# Patient Record
Sex: Female | Born: 1975 | Race: Black or African American | Hispanic: No | Marital: Single | Smoking: Never smoker
Health system: Southern US, Community
[De-identification: ages and names within clinical notes are randomized; demographics above are authoritative.]

## PROBLEM LIST (undated history)

## (undated) DIAGNOSIS — Z8669 Personal history of other diseases of the nervous system and sense organs: Secondary | ICD-10-CM

## (undated) DIAGNOSIS — F419 Anxiety disorder, unspecified: Secondary | ICD-10-CM

## (undated) HISTORY — DX: Anxiety disorder, unspecified: F41.9

## (undated) HISTORY — DX: Personal history of other diseases of the nervous system and sense organs: Z86.69

---

## 2007-06-14 ENCOUNTER — Ambulatory Visit: Payer: Self-pay | Admitting: Obstetrics & Gynecology

## 2007-06-19 ENCOUNTER — Ambulatory Visit (HOSPITAL_COMMUNITY): Admission: RE | Admit: 2007-06-19 | Discharge: 2007-06-19 | Payer: Self-pay | Admitting: Family Medicine

## 2007-07-05 ENCOUNTER — Ambulatory Visit: Payer: Self-pay | Admitting: Obstetrics & Gynecology

## 2010-04-30 ENCOUNTER — Inpatient Hospital Stay (INDEPENDENT_AMBULATORY_CARE_PROVIDER_SITE_OTHER)
Admission: RE | Admit: 2010-04-30 | Discharge: 2010-04-30 | Disposition: A | Discharge status: Home / Self Care | Payer: Self-pay | Source: Ambulatory Visit | Attending: Family Medicine | Admitting: Family Medicine

## 2010-04-30 DIAGNOSIS — F411 Generalized anxiety disorder: Secondary | ICD-10-CM

## 2010-04-30 DIAGNOSIS — R000 Tachycardia, unspecified: Secondary | ICD-10-CM

## 2010-05-31 NOTE — Group Therapy Note (Signed)
NAMEVANIYA, Sarah Walton NO.:  [id number]   MEDICAL RECORD NO.:  [id number]          PATIENT TYPE:  WOC   LOCATION:  [facility]                   FACILITY:  [facility]   PHYSICIAN:  Allie Bossier, MD        DATE OF BIRTH:  [birth year]   DATE OF SERVICE:  06/14/2007                                  CLINIC NOTE   CHIEF COMPLAINT:  Left pelvic pain.   HISTORY OF PRESENT ILLNESS:  This is a 35 year old African American  female with no significant past medical history who presents with left  pelvic pain since March that occurs 2-3 days before her period and  abates as soon as her bleeding starts.  She does not have any pelvic  pain between periods.  She has no spotting between periods.  She has  normal regular periods every 30 days with moderate flow.  The pain is  not associated with nausea, vomiting, diarrhea, fevers, or chills.  She  has not been sexually active in over 5 years.  She was seen in November  2008 for a regular physical at [facility], where she had her Pap  smear that was normal, but was told at that time that she had some  tightness on her left adnexa by the physician.  She has no family  history of ovarian, uterine, or breast cancer.  She does note her mother  has had multiple ovarian cysts and had a hysterectomy for what she  believes was fibroids.   ALLERGIES:  Possibly PENICILLIN, but the patient is unsure.   CURRENT MEDICATION:  None.   IMMUNIZATIONS:  The patient is unclear of her immunization status for  tetanus and flu, and whether she had chickenpox or not as a child.   MENSTRUAL HISTORY:  She reports her menstrual cycles as regular, every  30 days, and periods lasting approximately 5 days.  Pain is described as  above.  No spotting between periods, and flow is medium.   CONTRACEPTIVE HISTORY:  The patient is not currently using any birth  control and is not sexually active times many years.   OBSTETRICAL HISTORY:  She has been pregnant  once that ended in elective  abortion approximately 12 years ago.   GYNECOLOGIC HISTORY:  Last Pap smear in December 06, 2006, that was  normal.  She has no history of abnormal Pap smears, but does not get  them regularly, and had not had one previous to 2008 since 1998.  Date  of last mammogram was December 06, 2006, that was normal.   SURGICAL HISTORY:  None.   FAMILY HISTORY:  Mother with high blood pressure and ovarian cyst and  fibroids.   PAST MEDICAL HISTORY:  None.   SOCIAL HISTORY:  She lives alone.  She works in Clinical biochemist.  She  does not smoke, drinks occasional alcohol, and occasional sodas.  She  has not been sexually active in many, many years and denies any physical  or sexual abuse.   REVIEW OF SYSTEMS:  Completely negative except for the problems  described in the HPI.  ASSESSMENT:  This is a 35 year old African American female with no  significant past medical history who presents with left pelvic pain.   PLAN:  We will get a pelvic ultrasound and have the patient return to  clinic for followup of further results in 2 weeks.  In the meantime, use  Motrin or Tylenol p.r.n. pain.  Gave red flag for return to clinic.     ______________________________  Thereasa Parkin Unknown    ______________________________  Allie Bossier, MD    AU/MEDQ  D:  06/14/2007  T:  06/15/2007  Job:  [id number]

## 2011-01-04 ENCOUNTER — Ambulatory Visit (INDEPENDENT_AMBULATORY_CARE_PROVIDER_SITE_OTHER): Payer: BC Managed Care – PPO | Admitting: Family Medicine

## 2011-01-04 DIAGNOSIS — F411 Generalized anxiety disorder: Secondary | ICD-10-CM

## 2011-05-17 DIAGNOSIS — Z0271 Encounter for disability determination: Secondary | ICD-10-CM

## 2011-05-26 ENCOUNTER — Ambulatory Visit: Payer: BC Managed Care – PPO | Admitting: Family Medicine

## 2011-05-26 VITALS — BP 124/84 | HR 89 | Temp 98.2°F | Resp 16 | Ht 60.5 in | Wt 175.8 lb

## 2011-05-26 DIAGNOSIS — M659 Synovitis and tenosynovitis, unspecified: Secondary | ICD-10-CM

## 2011-05-26 DIAGNOSIS — M25539 Pain in unspecified wrist: Secondary | ICD-10-CM

## 2011-05-26 DIAGNOSIS — M65939 Unspecified synovitis and tenosynovitis, unspecified forearm: Secondary | ICD-10-CM

## 2011-05-26 DIAGNOSIS — M25531 Pain in right wrist: Secondary | ICD-10-CM

## 2011-05-26 DIAGNOSIS — M7150 Other bursitis, not elsewhere classified, unspecified site: Secondary | ICD-10-CM

## 2011-05-26 MED ORDER — MELOXICAM 7.5 MG PO TABS: 7.5000 mg | ORAL_TABLET | Freq: Two times a day (BID) | ORAL | Status: DC | PRN

## 2011-05-26 NOTE — Progress Notes (Signed)
[facility]:  Office Visit  Chief Complaint:  Chief Complaint  Patient presents with  . Wrist Pain    x right   numb right arm last night  1 yr,  left 5 months with thumb pain/pinching    HPI: Sarah CODERRE is a 36 y.o. female who complains of: 1. Bilateral wrist pain x 1 year. Has gotten worse in several days. Heavy lifting water bottles at work when working for [employer]. Now she does not do a a lot of repetitive motion. She has it inboth hands/wrist. She has worn a wrist brace on one side only. Dull throabbing pain but occ sharp 6/10 pain with heavy lifting. Assoaciated with lifting. Called nurse and was told to come in to get checked out for carpal tunnel syndrome  Past Medical History  Diagnosis Date  . Anxiety   . History of migraine headaches    History reviewed. No pertinent past surgical history. History   Social History  . Marital Status: Single    Spouse Name: N/A    Number of Children: N/A  . Years of Education: N/A   Social History Main Topics  . Smoking status: Never Smoker   . Smokeless tobacco: None  . Alcohol Use: No  . Drug Use: No  . Sexually Active: None   Other Topics Concern  . None   Social History Narrative  . None   Family History  Problem Relation Age of Onset  . Anemia Mother    Allergies  Allergen Reactions  . Penicillins Other (See Comments)    childhood   Prior to Admission medications   Not on File     ROS: The patient denies fevers, chills, night sweats, unintentional weight loss, chest pain, palpitations, wheezing, dyspnea on exertion, nausea, vomiting, abdominal pain, dysuria, hematuria, melena, + hand/wrist pain, numbness,   tingling.   All other systems have been reviewed and were otherwise negative with the exception of those mentioned in the HPI and as above.    PHYSICAL EXAM: Filed Vitals:   05/26/11 0851  BP: 124/84  Pulse: 89  Temp: 98.2 F (36.8 C)  Resp: 16   Filed Vitals:   05/26/11 0851    Height: 5' 0.5" (1.537 m)  Weight: 175 lb 12.8 oz (79.742 kg)   Body mass index is 33.77 kg/(m^2).  General: Alert, no acute distress HEENT:  Normocephalic, atraumatic, oropharynx patent.  Cardiovascular:  Regular rate and rhythm, no rubs murmurs or gallops.  No Carotid bruits, radial pulse intact. No pedal edema.  Respiratory: Clear to auscultation bilaterally.  No wheezes, rales, or rhonchi.  No cyanosis, no use of accessory musculature GI: No organomegaly, abdomen is soft and non-tender, positive bowel sounds.  No masses. Skin: No rashes. Neurologic: Facial musculature symmetric. Psychiatric: Patient is appropriate throughout our interaction. Lymphatic: No cervical lymphadenopathy Musculoskeletal: Gait intact. Left wrist: + tenosynovitis, + Dequervains, neg Phalens, neg Tinels,  neurovascular intact, ROM full Right wrist: + carpal tunnel tenderness, + Tinels. neurovascular intact, ROM full   LABS: No results found for this or any previous visit.   EKG/XRAY:   Primary read interpreted by Dr. Conley Rolls at [facility].   ASSESSMENT/PLAN: Encounter Diagnoses  Name Primary?  . Bilateral wrist pain Yes  . Tenosynovitis, wrist    Return in 1 month if no improvement in 1 month. Rx Mobic Patient declined Xrays for now due to high copay. She received left thumb spica splint and right carpal tunnel wrist brace Consider injections  if no improvement     Jacob Cicero PHUONG, DO 05/26/2011 10:06 AM

## 2011-06-19 ENCOUNTER — Emergency Department (HOSPITAL_COMMUNITY)
Admission: EM | Admit: 2011-06-19 | Discharge: 2011-06-19 | Disposition: A | Discharge status: Home / Self Care | Payer: BC Managed Care – PPO | Attending: Emergency Medicine | Admitting: Emergency Medicine

## 2011-06-19 ENCOUNTER — Encounter (HOSPITAL_COMMUNITY): Payer: Self-pay | Admitting: Emergency Medicine

## 2011-06-19 DIAGNOSIS — Z8659 Personal history of other mental and behavioral disorders: Secondary | ICD-10-CM

## 2011-06-19 DIAGNOSIS — F411 Generalized anxiety disorder: Secondary | ICD-10-CM | POA: Insufficient documentation

## 2011-06-19 DIAGNOSIS — T50991A Poisoning by other drugs, medicaments and biological substances, accidental (unintentional), initial encounter: Secondary | ICD-10-CM | POA: Insufficient documentation

## 2011-06-19 DIAGNOSIS — T50905A Adverse effect of unspecified drugs, medicaments and biological substances, initial encounter: Secondary | ICD-10-CM

## 2011-06-19 DIAGNOSIS — R209 Unspecified disturbances of skin sensation: Secondary | ICD-10-CM | POA: Insufficient documentation

## 2011-06-19 LAB — URINALYSIS, ROUTINE W REFLEX MICROSCOPIC
Bilirubin Urine: NEGATIVE
Glucose, UA: NEGATIVE mg/dL
Ketones, ur: NEGATIVE mg/dL
Leukocytes, UA: NEGATIVE
Specific Gravity, Urine: 1.023 (ref 1.005–1.030)
pH: 5.5 (ref 5.0–8.0)

## 2011-06-19 LAB — POCT I-STAT, CHEM 8
BUN: 6 mg/dL (ref 6–23)
Calcium, Ion: 1.17 mmol/L (ref 1.12–1.32)
Chloride: 99 mEq/L (ref 96–112)
Potassium: 3.6 mEq/L (ref 3.5–5.1)

## 2011-06-19 NOTE — ED Provider Notes (Signed)
History     CSN: [id number]  Arrival date & time 06/19/11  0532   First MD Initiated Contact with Patient 06/19/11 0541      Chief Complaint  Patient presents with  . Ingestion    (Consider location/radiation/quality/duration/timing/severity/associated sxs/prior treatment) HPI History provided by patient. Taking over-the-counter herbal supplement vegetable tablets for the last 2 days. The directions stay to take 3 tablets daily with meals or glass of water. Patient realized that she had been taking 3 at a time rather than over the course of the day for the last 2 days, became anxious and presents here for evaluation. She has tingling in her arms and complaints of burning in both of her armpits. No nausea vomiting. No chest pain or shortness of breath. She has noticed that her urine is darker and she is very concerned about that.  Past Medical History  Diagnosis Date  . Anxiety   . History of migraine headaches     History reviewed. No pertinent past surgical history.  Family History  Problem Relation Age of Onset  . Anemia Mother     History  Substance Use Topics  . Smoking status: Never Smoker   . Smokeless tobacco: Not on file  . Alcohol Use: No    OB History    Grav Para Term Preterm Abortions TAB SAB Ect Mult Living                  Review of Systems  Constitutional: Negative for fever and chills.  HENT: Negative for neck pain and neck stiffness.   Eyes: Negative for pain.  Respiratory: Negative for shortness of breath.   Cardiovascular: Negative for chest pain.  Gastrointestinal: Negative for abdominal pain.  Genitourinary: Negative for dysuria.  Musculoskeletal: Negative for back pain.  Skin: Negative for rash.  Neurological: Negative for headaches.  All other systems reviewed and are negative.    Allergies  Penicillins  Home Medications  No current outpatient prescriptions on file.  BP 143/75  Pulse 115  Temp(Src) 97.6 F (36.4 C) (Oral)   Resp 18  SpO2 98%  LMP 06/04/2011  Physical Exam  Constitutional: She is oriented to person, place, and time. She appears well-developed and well-nourished.  HENT:  Head: Normocephalic and atraumatic.  Eyes: Conjunctivae and EOM are normal. Pupils are equal, round, and reactive to light.  Neck: Trachea normal. Neck supple. No thyromegaly present.  Cardiovascular: Normal rate, regular rhythm, S1 normal, S2 normal and normal pulses.     No systolic murmur is present   No diastolic murmur is present  Pulses:      Radial pulses are 2+ on the right side, and 2+ on the left side.  Pulmonary/Chest: Effort normal and breath sounds normal. She has no wheezes. She has no rhonchi. She has no rales. She exhibits no tenderness.  Abdominal: Soft. Normal appearance and bowel sounds are normal. There is no tenderness. There is no CVA tenderness and negative Murphy's sign.  Musculoskeletal:       Moves all extremities x4 with distal neurovascular, motor and sensorium intact. No erythema or rash. Bilateral axilla examined without abnormalities  Neurological: She is alert and oriented to person, place, and time. She has normal strength. No cranial nerve deficit or sensory deficit. GCS eye subscore is 4. GCS verbal subscore is 5. GCS motor subscore is 6.  Skin: Skin is warm and dry. No rash noted. She is not diaphoretic.  Psychiatric: Her speech is normal.  Anxious. Cooperative and appropriate otherwise    ED Course  Procedures (including critical care time)  Results for orders placed during the hospital encounter of 06/19/11  URINALYSIS, ROUTINE W REFLEX MICROSCOPIC      Component Value Range   Color, Urine YELLOW  YELLOW    APPearance CLOUDY (*) CLEAR    Specific Gravity, Urine 1.023  1.005 - 1.030    pH 5.5  5.0 - 8.0    Glucose, UA NEGATIVE  NEGATIVE (mg/dL)   Hgb urine dipstick NEGATIVE  NEGATIVE    Bilirubin Urine NEGATIVE  NEGATIVE    Ketones, ur NEGATIVE  NEGATIVE (mg/dL)   Protein,  ur NEGATIVE  NEGATIVE (mg/dL)   Urobilinogen, UA 0.2  0.0 - 1.0 (mg/dL)   Nitrite NEGATIVE  NEGATIVE    Leukocytes, UA NEGATIVE  NEGATIVE   POCT I-STAT, CHEM 8      Component Value Range   Sodium 135  135 - 145 (mEq/L)   Potassium 3.6  3.5 - 5.1 (mEq/L)   Chloride 99  96 - 112 (mEq/L)   BUN 6  6 - 23 (mg/dL)   Creatinine, Ser 4.09  0.50 - 1.10 (mg/dL)   Glucose, Bld 811 (*) 70 - 99 (mg/dL)   Calcium, Ion 9.14  7.82 - 1.32 (mmol/L)   TCO2 23  0 - 100 (mmol/L)   Hemoglobin 12.9  12.0 - 15.0 (g/dL)   HCT 95.6  21.3 - 08.6 (%)     Poison control notified and no OD, supplement is basically a multivitamin. Poison control recommends no indication for observation, workup or further evaluation based on amount ingested. No concern for iron overdose.   MDM   Nursing notes reviewed. Vital signs reviewed. Urinalysis reviewed. Metabolic panel reviewed as above.  Reassurance provided. Patient stable for discharge home. Initially tachycardic with normalizing vital signs.  Repeat heart rate 88        Sunnie Nielsen, MD 06/19/11 [phone]

## 2011-06-19 NOTE — ED Notes (Signed)
Patient currently resting quietly in bed; no respiratory or acute distress noted.  Patient updated on plan of care; informed patient that lab results are back and that EDP will be in to talk about results.  Patient has no other questions or concerns; will continue to monitor.

## 2011-06-19 NOTE — ED Notes (Signed)
Lab at bedside

## 2011-06-19 NOTE — ED Notes (Signed)
Patient discharged home; ambulatory to discharge desk.  Patient instructed to follow up with her primary care physician, to follow directions on vitamin supplements (used as directed by primary care physician), and to return to the ED for new, worsening, or concerning symptoms.

## 2011-06-19 NOTE — Discharge Instructions (Signed)
Drug Allergy A drug allergy means you have a strange reaction to a medicine. You may have puffiness (swelling), itching, red rashes, and hives. Some allergic reactions can be life-threatening. HOME CARE  If you do not know what caused your reaction:  Write down medicines you use.   Write down any problems you have after using medicine.   Avoid things that cause a reaction.   You can see an allergy doctor to be tested for allergies.  If you have hives or a rash:  Take medicine as told by your doctor.   Place cold cloths on your skin.   Do not take hot baths or hot showers. Take baths in cool water.  If you are severely allergic:  Wear a medical bracelet or necklace that lists your allergy.   Carry your allergy kit or medicine shot to treat severe allergic reactions with you. These can save your life.   Do not drive until medicine from your shot has worn off, unless your doctor says it is okay.  GET HELP RIGHT AWAY IF:   Your mouth is puffy, or you have trouble breathing.   You have a tight feeling in your chest or throat.   You have hives, puffiness, or itching all over your body.   You throw up (vomit) or have watery poop (diarrhea).   You feel dizzy or pass out (faint).   You think you are having a reaction. Problems often start within 30 minutes after taking a medicine.   You are getting worse, not better.   You have new problems.   Your problems go away and then come back.  This is an emergency. Use your medicine shot or allergy kit as told. Call yourlocal emergency services (911 in [place] after the shot. Even if you feel better after the shot, you need to go to the hospital. You may need more medicine to control a severe reaction. MAKE SURE YOU:  Understand these instructions.   Will watch your condition.   Will get help right away if you are not doing well or get worse.

## 2011-06-19 NOTE — ED Notes (Addendum)
Patient states that she has been taking a new vegetarian multivitamin for the past two to three days; patient has been following directions on packaging (take three tablets daily after eating food or with a glass of water).  Patient states that instead of separating out the dosage yesterday, she took three tablets at bedtime with a glass of water.  Patient afraid that she overdosed/took too many pills at once and is now very anxious; patient complaining of a "burning" sensation in both arms; denies chest pain and shortness of breath.  Patient alert and oriented x4; PERRL present.  Dr. Dierdre Highman at bedside.  Will continue to monitor.

## 2011-06-19 NOTE — ED Notes (Addendum)
Patient here for possible overdose.  Patient thinks she has taken an overdose of her vegetarian vitamins.  She has been taking three pills at once daily.  She is feeling anxious at this time, feeling burning sensation in both arms.  Patient states the last time she took them was last night.

## 2011-06-20 ENCOUNTER — Encounter (HOSPITAL_COMMUNITY): Payer: Self-pay | Admitting: *Deleted

## 2011-06-20 ENCOUNTER — Emergency Department (HOSPITAL_COMMUNITY)
Admission: EM | Admit: 2011-06-20 | Discharge: 2011-06-20 | Disposition: A | Discharge status: Home / Self Care | Payer: BC Managed Care – PPO | Attending: Emergency Medicine | Admitting: Emergency Medicine

## 2011-06-20 ENCOUNTER — Emergency Department (HOSPITAL_COMMUNITY): Payer: BC Managed Care – PPO

## 2011-06-20 ENCOUNTER — Encounter (HOSPITAL_COMMUNITY): Payer: Self-pay | Admitting: Emergency Medicine

## 2011-06-20 DIAGNOSIS — F411 Generalized anxiety disorder: Secondary | ICD-10-CM | POA: Insufficient documentation

## 2011-06-20 DIAGNOSIS — R209 Unspecified disturbances of skin sensation: Secondary | ICD-10-CM | POA: Insufficient documentation

## 2011-06-20 DIAGNOSIS — R202 Paresthesia of skin: Secondary | ICD-10-CM

## 2011-06-20 DIAGNOSIS — R51 Headache: Secondary | ICD-10-CM | POA: Insufficient documentation

## 2011-06-20 DIAGNOSIS — G5600 Carpal tunnel syndrome, unspecified upper limb: Secondary | ICD-10-CM | POA: Insufficient documentation

## 2011-06-20 LAB — POCT I-STAT, CHEM 8
BUN: 9 mg/dL (ref 6–23)
Calcium, Ion: 1.17 mmol/L (ref 1.12–1.32)
Chloride: 103 mEq/L (ref 96–112)
HCT: 42 % (ref 36.0–46.0)
Potassium: 4.3 mEq/L (ref 3.5–5.1)
Sodium: 140 mEq/L (ref 135–145)

## 2011-06-20 LAB — PREGNANCY, URINE: Preg Test, Ur: NEGATIVE

## 2011-06-20 MED ORDER — IBUPROFEN 800 MG PO TABS: 800.0000 mg | ORAL_TABLET | Freq: Three times a day (TID) | ORAL | Status: AC | PRN

## 2011-06-20 NOTE — Discharge Instructions (Signed)
Carpal Tunnel Syndrome The carpal tunnel is a narrow hollow area in the wrist. It is formed by the wrist bones and ligaments. Nerves, blood vessels, and tendons (cord like structures which attach muscle to bone) on the palm side (the side of your hand in the direction your fingers bend) of your hand pass through the carpal tunnel. Repeated wrist motion or certain diseases may cause swelling within the tunnel. (That is why these are called repetitive trauma (damage caused by over use) disorders. It is also a common problem in late pregnancy.) This swelling pinches the main nerve in the wrist (median nerve) and causes the painful condition called carpal tunnel syndrome. A feeling of "pins and needles" may be noticed in the fingers or hand; however, the entire arm may ache from this condition. Carpal tunnel syndrome may clear up by itself. Cortisone injections may help. Sometimes, an operation may be needed to free the pinched nerve. An electromyogram (a type of test) may be needed to confirm this diagnosis (learning what is wrong). This is a test which measures nerve conduction. The nerve conduction is usually slowed in a carpal tunnel syndrome. HOME CARE INSTRUCTIONS   If your caregiver prescribed medication to help reduce swelling, take as directed.   If you were given a splint to keep your wrist from bending, use it as instructed. It is important to wear the splint at night. Use the splint for as long as you have pain or numbness in your hand, arm or wrist. This may take 1 to 2 months.   If you have pain at night, it may help to rub or shake your hand, or elevate your hand above the level of your heart (the center of your chest).   It is important to give your wrist a rest by stopping the activities that are causing the problem. If your symptoms (problems) are work-related, you may need to talk to your employer about changing to a job that does not require using your wrist.   Only take over-the-counter  or prescription medicines for pain, discomfort, or fever as directed by your caregiver.   Following periods of extended use, particularly strenuous use, apply an ice pack wrapped in a towel to the anterior (palm) side of the affected wrist for 20 to 30 minutes. Repeat as needed three to four times per day. This will help reduce the swelling.   Follow all instructions for follow-up with your caregiver. This includes any orthopedic referrals, physical therapy, and rehabilitation. Any delay in obtaining necessary care could result in a delay or failure of your condition to heal.  SEEK IMMEDIATE MEDICAL CARE IF:   You are still having pain and numbness following a week of treatment.   You develop new, unexplained symptoms.   Your current symptoms are getting worse and are not helped or controlled with medications.  MAKE SURE YOU:   Understand these instructions.   Will watch your condition.   Will get help right away if you are not doing well or get worse.  Document Released: 12/31/1999 Document Revised: 12/22/2010 Document Reviewed: 11/18/2010 [facility] Patient Information 2012 [place], [place].  Paresthesia Paresthesia is an abnormal burning or prickling sensation. This sensation is generally felt in the hands, arms, legs, or feet. However, it may occur in any part of the body. It is usually not painful. The feeling may be described as:  Tingling or numbness.   "Pins and needles."   Skin crawling.   Buzzing.   Limbs "falling asleep."  Itching.  Most people experience temporary (transient) paresthesia at some time in their lives. CAUSES  Paresthesia may occur when you breathe too quickly (hyperventilation). It can also occur without any apparent cause. Commonly, paresthesia occurs when pressure is placed on a nerve. The feeling quickly goes away once the pressure is removed. For some people, however, paresthesia is a long-lasting (chronic) condition caused by an underlying disorder.  The underlying disorder may be:  A traumatic, direct injury to nerves. Examples include a:   Broken (fractured) neck.   Fractured skull.   A disorder affecting the brain and spinal cord (central nervous system). Examples include:   Transverse myelitis.   Encephalitis.   Transient ischemic attack.   Multiple sclerosis.   Stroke.   Tumor or blood vessel problems, such as an arteriovenous malformation pressing against the brain or spinal cord.   A condition that damages the peripheral nerves (peripheral neuropathy). Peripheral nerves are not part of the brain and spinal cord. These conditions include:   Diabetes.   Peripheral vascular disease.   Nerve entrapment syndromes, such as carpal tunnel syndrome.   Shingles.   Hypothyroidism.   Vitamin B12 deficiencies.   Alcoholism.   Heavy metal poisoning (lead, arsenic).   Rheumatoid arthritis.   Systemic lupus erythematosus.  DIAGNOSIS  Your caregiver will attempt to find the underlying cause of your paresthesia. Your caregiver may:  Take your medical history.   Perform a physical exam.   Order various lab tests.   Order imaging tests.  TREATMENT  Treatment for paresthesia depends on the underlying cause. HOME CARE INSTRUCTIONS  Avoid drinking alcohol.   You may consider massage or acupuncture to help relieve your symptoms.   Keep all follow-up appointments as directed by your caregiver.  SEEK IMMEDIATE MEDICAL CARE IF:   You feel weak.   You have trouble walking or moving.   You have problems with speech or vision.   You feel confused.   You cannot control your bladder or bowel movements.   You feel numbness after an injury.   You faint.   Your burning or prickling feeling gets worse when walking.   You have pain, cramps, or dizziness.   You develop a rash.  MAKE SURE YOU:  Understand these instructions.   Will watch your condition.   Will get help right away if you are not doing well  or get worse.  Document Released: 12/23/2001 Document Revised: 12/22/2010 Document Reviewed: 09/23/2010 [facility] Patient Information 2012 [place], [place].

## 2011-06-20 NOTE — ED Notes (Signed)
Pt d/c home in NAD. Pt voiced understanding of d/c instructions and follow up care.  

## 2011-06-20 NOTE — ED Notes (Signed)
Complaining of generalized numbness and tingling worse on left side. States began app Sunday morning.

## 2011-06-20 NOTE — ED Notes (Signed)
Pt c/o left arm pain with tingling and numbness; pt was seen for same on Sunday; pt sts not better; pt wearing brace for  Tendinitis and still having pain and numbness

## 2011-06-20 NOTE — ED Notes (Signed)
Pt c/o left sided numbness and tingling in left arm and left leg, also burning sensation in the chest.  Was just d/c earlier today, concerned sx were dismissed due to anxiety.

## 2011-06-20 NOTE — ED Notes (Addendum)
Pt states that her "left arm is numb and tingly. My Doctor told me it was carpal tunnel." Pt then states "I also have burning sensations in both arms and breasts and then when that happens my feet get numb, tingly, and prickly. I am pretty sure my carpal tunnel nerve is doing all of this and it's all messed up." Pt in NAD. Pt can move all extremities equally and can feel light touch in all extremities.

## 2011-06-20 NOTE — ED Provider Notes (Signed)
History     CSN: [id number]  Arrival date & time 06/20/11  1939   First MD Initiated Contact with Patient 06/20/11 2131      Chief Complaint  Patient presents with  . Numbness    (Consider location/radiation/quality/duration/timing/severity/associated sxs/prior treatment) HPI Comments: Patient reports 3 days of full body burning sensation and tingling of the left arm and leg.  Pt has burning under both of her arms and across her chest, tingling in both of her feet, and tingling in her left arm and left leg.  States that these symptoms have been ongoing for three days.  Has also had headaches - states these are her typical tension headaches and are unchanged from her normal.  She has been taking anacin (aspirin and caffeine) for her symptoms.  Has not taken any more than is recommended.  This is her third visit to the ED since the symptoms began.  She had one visit on 06/19/11, and one visit earlier today.  Pt denies fevers, recent illness, recent travel,neck pain, pain or swelling in her joints, any tick bites, any recent changes in food or medicine.  No urinary or vaginal symptoms, no abdominal or chest pain, no N/V/D, no abnormal bleeding, no cough.  Has a remote family history of lupus and MS in a great aunt.  States she does not drink caffeine.    The history is provided by the patient, medical records and a relative.    Past Medical History  Diagnosis Date  . Anxiety   . History of migraine headaches     History reviewed. No pertinent past surgical history.  Family History  Problem Relation Age of Onset  . Anemia Mother     History  Substance Use Topics  . Smoking status: Never Smoker   . Smokeless tobacco: Not on file  . Alcohol Use: No    OB History    Grav Para Term Preterm Abortions TAB SAB Ect Mult Living                  Review of Systems  Constitutional: Negative for fever, chills, activity change and appetite change.  HENT: Negative for sore throat, neck pain  and neck stiffness.   Eyes: Negative for visual disturbance.  Respiratory: Negative for cough and shortness of breath.   Cardiovascular: Negative for chest pain and leg swelling.  Gastrointestinal: Negative for nausea, vomiting, abdominal pain, diarrhea, constipation and blood in stool.  Genitourinary: Negative for dysuria, urgency, frequency, vaginal bleeding, vaginal discharge and menstrual problem.  Musculoskeletal: Negative for joint swelling, arthralgias and gait problem.  Skin: Negative for rash.  Neurological: Positive for numbness and headaches. Negative for syncope and weakness.  Psychiatric/Behavioral: Negative for confusion.  All other systems reviewed and are negative.    Allergies  Penicillins  Home Medications   Current Outpatient Rx  Name Route Sig Dispense Refill  . ALPRAZOLAM 0.25 MG PO TABS Oral Take 0.25 mg by mouth every 6 (six) hours as needed. For anxiety    . SERTRALINE HCL 50 MG PO TABS Oral Take 50 mg by mouth daily.      BP 153/66  Pulse 118  Temp(Src) 98.6 F (37 C) (Oral)  Resp 20  SpO2 100%  LMP 06/04/2011  Physical Exam  Nursing note and vitals reviewed. Constitutional: She is oriented to person, place, and time. She appears well-developed and well-nourished.  HENT:  Head: Normocephalic and atraumatic.  Neck: Neck supple.  Cardiovascular: Normal rate, regular rhythm and normal  heart sounds.   Pulmonary/Chest: Breath sounds normal. No respiratory distress. She has no wheezes. She has no rales. She exhibits no tenderness.  Abdominal: Soft. Bowel sounds are normal. She exhibits no distension. There is no tenderness. There is no rebound and no guarding.  Musculoskeletal:       Cervical back: Normal.       Thoracic back: Normal.       Lumbar back: Normal.  Neurological: She is alert and oriented to person, place, and time. She has normal strength. No cranial nerve deficit or sensory deficit. She exhibits normal muscle tone. Coordination and gait  normal. GCS eye subscore is 4. GCS verbal subscore is 5. GCS motor subscore is 6.       Extremities: strength 5/5, sensation intact, distal pulses intact.   Psychiatric: Her speech is normal. Her mood appears anxious.    ED Course  Procedures (including critical care time)  Labs Reviewed  POCT I-STAT, CHEM 8 - Abnormal; Notable for the following:    Glucose, Bld 104 (*)    All other components within normal limits  PREGNANCY, URINE  LAB REPORT - SCANNED   Ct Head Wo Contrast  06/20/2011  *RADIOLOGY REPORT*  Clinical Data: Numbness and tingling in extremities.  CT HEAD WITHOUT CONTRAST  Technique:  Contiguous axial images were obtained from the base of the skull through the vertex without contrast.  Comparison: None.  Findings: The ventricles are normal.  No extra-axial fluid collections are seen.  The brainstem and cerebellum are unremarkable.  No acute intracranial findings such as infarction or hemorrhage.  No mass lesions.  The bony calvarium is intact.  The visualized paranasal sinuses and mastoid air cells are clear.  IMPRESSION: No acute intracranial findings or mass lesions.  Original Report Authenticated By: P. Loralie Champagne, M.D.    Date: 06/20/2011  Rate: 97  Rhythm: normal sinus rhythm  QRS Axis: normal  Intervals: normal  ST/T Wave abnormalities: normal  Conduction Disutrbances: none  Narrative Interpretation:   Old EKG Reviewed: No significant changes noted     1. Paresthesias       MDM  Patient with several days of burning and tingling throughout her entire body.  No neurological deficits and no weakness.  No fevers.  No head or neck injuries.  Normal head CT, ekg, electrolytes.  No anemia.  Pt has no known exposures or ingestions.  Pt d/c home with neurological follow up, return precautions.  Patient verbalizes understanding and agrees with plan.          Rise Patience, [place] 06/21/11 1513

## 2011-06-20 NOTE — Discharge Instructions (Signed)
Read the information below. Please call [facility] and [facility] for follow up appointments.  If you develop weakness of your arms or legs, difficulty walking or speaking, or change in your vision, return to the ER for a recheck.  You may return to the ER at any time for worsening condition or any new symptoms that concern you.   Paresthesia Paresthesia is an abnormal burning or prickling sensation. This sensation is generally felt in the hands, arms, legs, or feet. However, it may occur in any part of the body. It is usually not painful. The feeling may be described as:  Tingling or numbness.   "Pins and needles."   Skin crawling.   Buzzing.   Limbs "falling asleep."   Itching.  Most people experience temporary (transient) paresthesia at some time in their lives. CAUSES  Paresthesia may occur when you breathe too quickly (hyperventilation). It can also occur without any apparent cause. Commonly, paresthesia occurs when pressure is placed on a nerve. The feeling quickly goes away once the pressure is removed. For some people, however, paresthesia is a long-lasting (chronic) condition caused by an underlying disorder. The underlying disorder may be:  A traumatic, direct injury to nerves. Examples include a:   Broken (fractured) neck.   Fractured skull.   A disorder affecting the brain and spinal cord (central nervous system). Examples include:   Transverse myelitis.   Encephalitis.   Transient ischemic attack.   Multiple sclerosis.   Stroke.   Tumor or blood vessel problems, such as an arteriovenous malformation pressing against the brain or spinal cord.   A condition that damages the peripheral nerves (peripheral neuropathy). Peripheral nerves are not part of the brain and spinal cord. These conditions include:   Diabetes.   Peripheral vascular disease.   Nerve entrapment syndromes, such as carpal tunnel syndrome.   Shingles.   Hypothyroidism.    Vitamin B12 deficiencies.   Alcoholism.   Heavy metal poisoning (lead, arsenic).   Rheumatoid arthritis.   Systemic lupus erythematosus.  DIAGNOSIS  Your caregiver will attempt to find the underlying cause of your paresthesia. Your caregiver may:  Take your medical history.   Perform a physical exam.   Order various lab tests.   Order imaging tests.  TREATMENT  Treatment for paresthesia depends on the underlying cause. HOME CARE INSTRUCTIONS  Avoid drinking alcohol.   You may consider massage or acupuncture to help relieve your symptoms.   Keep all follow-up appointments as directed by your caregiver.  SEEK IMMEDIATE MEDICAL CARE IF:   You feel weak.   You have trouble walking or moving.   You have problems with speech or vision.   You feel confused.   You cannot control your bladder or bowel movements.   You feel numbness after an injury.   You faint.   Your burning or prickling feeling gets worse when walking.   You have pain, cramps, or dizziness.   You develop a rash.  MAKE SURE YOU:  Understand these instructions.   Will watch your condition.   Will get help right away if you are not doing well or get worse.  Document Released: 12/23/2001 Document Revised: 12/22/2010 Document Reviewed: 09/23/2010 [facility] Patient Information 2012 [place], [place].

## 2011-06-20 NOTE — ED Provider Notes (Signed)
History  Scribed for [employer]. Bernette Mayers, MD, the patient was seen in room STRE3/STRE3. This chart was scribed by Candelaria Stagers. The patient's care started at 6:29 PM   CSN: [id number]  Arrival date & time 06/20/11  1735   First MD Initiated Contact with Patient 06/20/11 1827      Chief Complaint  Patient presents with  . Arm Pain     HPI Sarah Walton is a 36 y.o. female who presents to the Emergency Department complaining of arm pain that started several days ago.  Pt states that about one month ago she was diagnosed with tendonitis and carpel tunnel in her wrists and now she is experiencing burning and tingling in her arms and feet.  Pt states that her PCP gave her braces for both wrists which she has been wearing for about one month.  Pt has a h/o anxiety which is exacerbated by her current sx.  Past Medical History  Diagnosis Date  . Anxiety   . History of migraine headaches     History reviewed. No pertinent past surgical history.  Family History  Problem Relation Age of Onset  . Anemia Mother     History  Substance Use Topics  . Smoking status: Never Smoker   . Smokeless tobacco: Not on file  . Alcohol Use: No    OB History    Grav Para Term Preterm Abortions TAB SAB Ect Mult Living                  Review of Systems  Musculoskeletal: Positive for arthralgias (arm pain bilaterally).  Neurological: Positive for numbness.  Psychiatric/Behavioral: The patient is nervous/anxious.   All other systems reviewed and are negative.    Allergies  Penicillins  Home Medications   Current Outpatient Rx  Name Route Sig Dispense Refill  . ALPRAZOLAM 0.25 MG PO TABS Oral Take 0.25 mg by mouth every 6 (six) hours as needed. For anxiety    . SERTRALINE HCL 50 MG PO TABS Oral Take 50 mg by mouth daily.      BP 129/88  Pulse 103  Temp(Src) 97.3 F (36.3 C) (Oral)  Resp 18  SpO2 96%  LMP 06/04/2011  Physical Exam  Nursing note and vitals  reviewed. Constitutional: She is oriented to person, place, and time. She appears well-developed and well-nourished. No distress.  HENT:  Head: Normocephalic and atraumatic.  Eyes: EOM are normal. Pupils are equal, round, and reactive to light.  Neck: Neck supple. No tracheal deviation present.  Cardiovascular: Normal rate.   Pulmonary/Chest: Effort normal. No respiratory distress.  Abdominal: Soft. She exhibits no distension.  Musculoskeletal: Normal range of motion. She exhibits no edema.  Neurological: She is alert and oriented to person, place, and time. She has normal strength and normal reflexes. No cranial nerve deficit or sensory deficit.  Skin: Skin is warm and dry.  Psychiatric: She has a normal mood and affect. Her behavior is normal.    ED Course  Procedures   DIAGNOSTIC STUDIES: Oxygen Saturation is 96% on room air, normal by my interpretation.    COORDINATION OF CARE:     Labs Reviewed - No data to display No results found.   1. Carpal tunnel syndrome   2. Paresthesia       MDM  Pt with previously diagnosed L carpal tunnel syndrome reports pain and numbness in L wrist radiating up her arm and to her entire body. She was seen 2 days ago for vitamin  overdose and had similar symptoms then. Labs, including chemistry, were normal. Neuro exam today is normal. Advised to continue wearing brace provided by PCP and given Neuro referral.   I personally performed the services described in the documentation, which were scribed in my presence. The recorded information has been reviewed and considered.             Tajon Moring B. Bernette Mayers, MD 06/20/11 1610

## 2011-06-21 NOTE — ED Provider Notes (Signed)
Medical screening examination/treatment/procedure(s) were performed by non-physician practitioner and as supervising physician I was immediately available for consultation/collaboration.   Joya Gaskins, MD 06/21/11 [phone]

## 2011-09-20 ENCOUNTER — Other Ambulatory Visit: Payer: Self-pay | Admitting: Family Medicine

## 2011-09-20 NOTE — Telephone Encounter (Signed)
OK X 1, NEEDS RECHECK FOR MORE 

## 2011-11-11 ENCOUNTER — Other Ambulatory Visit: Payer: Self-pay | Admitting: Physician Assistant

## 2015-02-25 ENCOUNTER — Emergency Department (INDEPENDENT_AMBULATORY_CARE_PROVIDER_SITE_OTHER)
Admission: EM | Admit: 2015-02-25 | Discharge: 2015-02-25 | Disposition: A | Discharge status: Home / Self Care | Payer: Self-pay | Source: Home / Self Care | Attending: Family Medicine | Admitting: Family Medicine

## 2015-02-25 ENCOUNTER — Encounter (HOSPITAL_COMMUNITY): Payer: Self-pay | Admitting: *Deleted

## 2015-02-25 ENCOUNTER — Other Ambulatory Visit (HOSPITAL_COMMUNITY)
Admission: RE | Admit: 2015-02-25 | Discharge: 2015-02-25 | Disposition: A | Discharge status: Home / Self Care | Payer: Self-pay | Source: Ambulatory Visit | Attending: Family Medicine | Admitting: Family Medicine

## 2015-02-25 DIAGNOSIS — B349 Viral infection, unspecified: Secondary | ICD-10-CM

## 2015-02-25 LAB — POCT RAPID STREP A: Streptococcus, Group A Screen (Direct): NEGATIVE

## 2015-02-25 NOTE — ED Notes (Signed)
Pt  Reports   Symptoms  Of  Body  Aches     sorethroat  Headache        Body  Aches           Symptoms  Not  releived   By  otc  meds       /  Honey  /  Ice         Pt    Reports  The    Symptoms  Began  Several  Days  Ago

## 2015-02-25 NOTE — Discharge Instructions (Signed)
Viral Infections °A viral infection can be caused by different types of viruses. Most viral infections are not serious and resolve on their own. However, some infections may cause severe symptoms and may lead to further complications. °SYMPTOMS °Viruses can frequently cause: °· Minor sore throat. °· Aches and pains. °· Headaches. °· Runny nose. °· Different types of rashes. °· Watery eyes. °· Tiredness. °· Cough. °· Loss of appetite. °· Gastrointestinal infections, resulting in nausea, vomiting, and diarrhea. °These symptoms do not respond to antibiotics because the infection is not caused by bacteria. However, you might catch a bacterial infection following the viral infection. This is sometimes called a "superinfection." Symptoms of such a bacterial infection may include: °· Worsening sore throat with pus and difficulty swallowing. °· Swollen neck glands. °· Chills and a high or persistent fever. °· Severe headache. °· Tenderness over the sinuses. °· Persistent overall ill feeling (malaise), muscle aches, and tiredness (fatigue). °· Persistent cough. °· Yellow, green, or brown mucus production with coughing. °HOME CARE INSTRUCTIONS  °· Only take over-the-counter or prescription medicines for pain, discomfort, diarrhea, or fever as directed by your caregiver. °· Drink enough water and fluids to keep your urine clear or pale yellow. Sports drinks can provide valuable electrolytes, sugars, and hydration. °· Get plenty of rest and maintain proper nutrition. Soups and broths with crackers or rice are fine. °SEEK IMMEDIATE MEDICAL CARE IF:  °· You have severe headaches, shortness of breath, chest pain, neck pain, or an unusual rash. °· You have uncontrolled vomiting, diarrhea, or you are unable to keep down fluids. °· You or your child has an oral temperature above 102° F (38.9° C), not controlled by medicine. °· Your baby is older than 3 months with a rectal temperature of 102° F (38.9° C) or higher. °· Your baby is 3  months old or younger with a rectal temperature of 100.4° F (38° C) or higher. °MAKE SURE YOU:  °· Understand these instructions. °· Will watch your condition. °· Will get help right away if you are not doing well or get worse. °  °This information is not intended to replace advice given to you by your health care provider. Make sure you discuss any questions you have with your health care provider. °  °Document Released: 10/12/2004 Document Revised: 03/27/2011 Document Reviewed: 06/10/2014 °Elsevier Interactive Patient Education ©2016 [employer]vier Inc. ° °

## 2015-02-25 NOTE — ED Provider Notes (Signed)
CSN: [id number]     Arrival date & time 02/25/15  1306 History   First MD Initiated Contact with Patient 02/25/15 1339     Chief Complaint  Patient presents with  . 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  No pain, symptoms present a couple of days, including sore throat Past Medical History  Diagnosis Date  . Anxiety   . History of migraine headaches    History reviewed. No pertinent past surgical history. Family History  Problem Relation Age of Onset  . Anemia Mother    Social History  Substance Use Topics  . Smoking status: Never Smoker   . Smokeless tobacco: None  . Alcohol Use: No   OB History    No data available     Review of Systems ROS +'ve sore throat, not feeling well  Denies: HEADACHE, NAUSEA, ABDOMINAL PAIN, CHEST PAIN, CONGESTION, DYSURIA, SHORTNESS OF BREATH   Allergies  Penicillins  Home Medications   Prior to Admission medications   Medication Sig Start Date End Date Taking? Authorizing Provider  ALPRAZolam (XANAX) 0.25 MG tablet Take 0.25 mg by mouth every 6 (six) hours as needed. For anxiety    Historical Provider, MD  sertraline (ZOLOFT) 50 MG tablet TAKE 1/2 TABLET EACH AM X 2 WEEKS THEN 1 TABLET EACH AM.TAKE FOR ANXIETY 11/11/11   Sondra Barges, PA-C   Meds Ordered and Administered this Visit  Medications - No data to display  BP 150/94 mmHg  Pulse 90  Temp(Src) 98.4 F (36.9 C) (Oral)  Resp 16  SpO2 99%  LMP 02/25/2015 No data found.   Physical Exam  Constitutional: She is oriented to person, place, and time. She appears well-developed and well-nourished.  HENT:  Head: Atraumatic.  Right Ear: External ear normal.  Left Ear: External ear normal.  Mouth/Throat: Oropharynx is clear and moist. No oropharyngeal exudate.  Eyes: Conjunctivae are  normal.  Neck: Normal range of motion. Neck supple.  Cardiovascular: Normal rate, regular rhythm and normal heart sounds.   Pulmonary/Chest: Effort normal and breath sounds normal.  Abdominal: Soft.  Neurological: She is alert and oriented to person, place, and time.  Skin: Skin is warm and dry.  Psychiatric: She has a normal mood and affect. Her behavior is normal.  Nursing note and vitals reviewed.   ED Course  Procedures (including critical care time)  Labs Review Labs Reviewed  POCT RAPID STREP A    Imaging Review No results found.   Visual Acuity Review  Right Eye Distance:   Left Eye Distance:   Bilateral Distance:    Right Eye Near:   Left Eye Near:    Bilateral Near:         MDM   1. Viral illness    Patient is advised to continue home symptomatic treatment.  Patient is advised that if there are new or worsening symptoms or attend the emergency department, or contact primary care provider. Instructions of care provided discharged home in stable condition. Return to work/school note provided.  THIS NOTE WAS GENERATED USING A VOICE RECOGNITION SOFTWARE PROGRAM. ALL REASONABLE EFFORTS  WERE MADE TO PROOFREAD THIS DOCUMENT FOR ACCURACY.     Tharon Aquas, PA 02/25/15 [phone]

## 2015-02-28 LAB — CULTURE, GROUP A STREP (THRC)

## 2015-06-10 ENCOUNTER — Encounter (HOSPITAL_COMMUNITY): Payer: Self-pay | Admitting: Emergency Medicine

## 2015-06-10 ENCOUNTER — Emergency Department (HOSPITAL_COMMUNITY)
Admission: EM | Admit: 2015-06-10 | Discharge: 2015-06-11 | Disposition: A | Discharge status: Other Acute Inpatient Hospital | Payer: Self-pay | Attending: Emergency Medicine | Admitting: Emergency Medicine

## 2015-06-10 DIAGNOSIS — R000 Tachycardia, unspecified: Secondary | ICD-10-CM | POA: Insufficient documentation

## 2015-06-10 DIAGNOSIS — Z79899 Other long term (current) drug therapy: Secondary | ICD-10-CM | POA: Insufficient documentation

## 2015-06-10 DIAGNOSIS — F23 Brief psychotic disorder: Secondary | ICD-10-CM | POA: Insufficient documentation

## 2015-06-10 DIAGNOSIS — F203 Undifferentiated schizophrenia: Secondary | ICD-10-CM | POA: Diagnosis present

## 2015-06-10 LAB — RAPID URINE DRUG SCREEN, HOSP PERFORMED
Amphetamines: NOT DETECTED
Barbiturates: NOT DETECTED
Benzodiazepines: NOT DETECTED
COCAINE: NOT DETECTED
OPIATES: NOT DETECTED
Tetrahydrocannabinol: NOT DETECTED

## 2015-06-10 LAB — BASIC METABOLIC PANEL
ANION GAP: 18 — AB (ref 5–15)
BUN: 21 mg/dL — ABNORMAL HIGH (ref 6–20)
CALCIUM: 9.1 mg/dL (ref 8.9–10.3)
CHLORIDE: 100 mmol/L — AB (ref 101–111)
CO2: 18 mmol/L — ABNORMAL LOW (ref 22–32)
CREATININE: 1.02 mg/dL — AB (ref 0.44–1.00)
GFR calc non Af Amer: >60 mL/min (ref >60)
Glucose, Bld: 102 mg/dL — ABNORMAL HIGH (ref 65–99)
Potassium: 3.5 mmol/L (ref 3.5–5.1)
SODIUM: 136 mmol/L (ref 135–145)

## 2015-06-10 LAB — CBC WITH DIFFERENTIAL/PLATELET
Basophils Absolute: 0 10*3/uL (ref 0.0–0.1)
Basophils Relative: 0 %
EOS ABS: 0 10*3/uL (ref 0.0–0.7)
Eosinophils Relative: 0 %
HCT: 38.4 % (ref 36.0–46.0)
HEMOGLOBIN: 12.8 g/dL (ref 12.0–15.0)
Lymphocytes Relative: 22 %
Lymphs Abs: 1.7 10*3/uL (ref 0.7–4.0)
MCH: 26.8 pg (ref 26.0–34.0)
MCHC: 33.3 g/dL (ref 30.0–36.0)
MCV: 80.5 fL (ref 78.0–100.0)
Monocytes Absolute: 1.3 10*3/uL — ABNORMAL HIGH (ref 0.1–1.0)
Monocytes Relative: 17 %
NEUTROS PCT: 61 %
Neutro Abs: 4.8 10*3/uL (ref 1.7–7.7)
Platelets: 308 10*3/uL (ref 150–400)
RBC: 4.77 MIL/uL (ref 3.87–5.11)
RDW: 13.6 % (ref 11.5–15.5)
WBC: 7.9 10*3/uL (ref 4.0–10.5)

## 2015-06-10 LAB — URINE MICROSCOPIC-ADD ON

## 2015-06-10 LAB — URINALYSIS, ROUTINE W REFLEX MICROSCOPIC
GLUCOSE, UA: NEGATIVE mg/dL
Ketones, ur: >80 mg/dL — AB
LEUKOCYTES UA: NEGATIVE
Nitrite: NEGATIVE
PH: 6 (ref 5.0–8.0)
PROTEIN: 100 mg/dL — AB
SPECIFIC GRAVITY, URINE: 1.035 — AB (ref 1.005–1.030)

## 2015-06-10 LAB — ACETAMINOPHEN LEVEL

## 2015-06-10 LAB — SALICYLATE LEVEL

## 2015-06-10 LAB — ETHANOL

## 2015-06-10 MED ORDER — ZIPRASIDONE MESYLATE 20 MG IM SOLR
10.0000 mg | Freq: Once | INTRAMUSCULAR | Status: AC
  Administered 2015-06-10: 10 mg via INTRAMUSCULAR
  Filled 2015-06-10: qty 20

## 2015-06-10 MED ORDER — ONDANSETRON HCL 4 MG/2ML IJ SOLN
4.0000 mg | Freq: Once | INTRAMUSCULAR | Status: AC
  Administered 2015-06-10: 4 mg via INTRAVENOUS
  Filled 2015-06-10: qty 2

## 2015-06-10 MED ORDER — STERILE WATER FOR INJECTION IJ SOLN
INTRAMUSCULAR | Status: AC
  Administered 2015-06-10: 10 mL
  Filled 2015-06-10: qty 10

## 2015-06-10 MED ORDER — ACETAMINOPHEN 325 MG PO TABS: 650.0000 mg | ORAL_TABLET | ORAL | Status: DC | PRN

## 2015-06-10 MED ORDER — SODIUM CHLORIDE 0.9 % IV BOLUS (SEPSIS)
1000.0000 mL | Freq: Once | INTRAVENOUS | Status: AC
  Administered 2015-06-10: 1000 mL via INTRAVENOUS

## 2015-06-10 MED ORDER — LORAZEPAM 1 MG PO TABS: 1.0000 mg | ORAL_TABLET | Freq: Three times a day (TID) | ORAL | Status: DC | PRN

## 2015-06-10 NOTE — Progress Notes (Signed)
EDCM went to speak to patient's mother at bedside, however, patient's mother has left.    [facility], informed patient of services there.  EDCM also provided patient with list of pcps who accept self pay patients, list of discount pharmacies and websites needymeds.org and GoodRX.com for medication assistance, phone number to inquire about the orange card, phone number to inquire about Medicaid, phone number to inquire about the Affordable Care Act, financial resources in the community such as local churches, salvation army, urban ministries, and dental assistance for uninsured patients.   No further EDCM needs at this time.  These resources were placed in patient's IVC folder.  [facility] to [facility] due to mental status.

## 2015-06-10 NOTE — ED Notes (Addendum)
Pt left triage room and was attempting to leave. Medical staff convinced pt to sit back in room and speak with the doctor. [facility] was [facility] campus. Pt is a missing persons, missing persons report filed yesterday after pt left her mother to get her car and never returned. Pt's aunt arrived at hospital once made aware pt was here and sts that pt has had an episode like this before where she has been really stressed and then "snapped." Per aunt, pt's mother is a disabled veteran and asks a lot of pt. Pt is not answering questions, but instead continues to speak about the evil spirits in her family. Pt sts "You can't control God, I'm sorry if he is using me to get to you but you can't control God." Pt continues to walk out of room while being spoken to. Pt also sts "I'm hearing fire alarms go off but ya'll ain't leaving."

## 2015-06-10 NOTE — ED Provider Notes (Addendum)
CSN: [id number]     Arrival date & time 06/10/15  1858 History   First MD Initiated Contact with Patient 06/10/15 1947     Chief Complaint  Patient presents with  . Medical Clearance  . Hallucinations     (Consider location/radiation/quality/duration/timing/severity/associated sxs/prior Treatment) HPI Comments: Patient presents escorted by security from [facility]. She was found wandering the streets on the [facility]. She had been reported as a missing person which was filed yesterday. She does have a history of schizophrenia. Her aunt states that she's not taking her medications. Per aunt, she's been hallucinating. She is talking about her grandkids when in fact she doesn't have grandkids. She repeats over and over the carotids trying to talk to her. She denies any drug or alcohol use. She hasn't reported any suicidal ideations.   Past Medical History  Diagnosis Date  . Anxiety   . History of migraine headaches    History reviewed. No pertinent past surgical history. Family History  Problem Relation Age of Onset  . Anemia Mother    Social History  Substance Use Topics  . Smoking status: Never Smoker   . Smokeless tobacco: None  . Alcohol Use: No   OB History    No data available     Review of Systems  Unable to perform ROS: Mental status change      Allergies  Penicillins  Home Medications   Prior to Admission medications   Medication Sig Start Date End Date Taking? Authorizing Provider  ALPRAZolam (XANAX) 0.25 MG tablet Take 0.25 mg by mouth every 6 (six) hours as needed. For anxiety    Historical Provider, MD  sertraline (ZOLOFT) 50 MG tablet TAKE 1/2 TABLET EACH AM X 2 WEEKS THEN 1 TABLET EACH AM.TAKE FOR ANXIETY 11/11/11   Ryan M Dunn, PA-C   BP 123/77 mmHg  Pulse 89  Temp(Src) 97.9 F (36.6 C) (Axillary)  Resp 18  SpO2 100% Physical Exam  Constitutional: She is oriented to person, place, and time. She appears well-developed and well-nourished.  HENT:   Head: Normocephalic and atraumatic.  Eyes: Pupils are equal, round, and reactive to light.  Neck: Normal range of motion. Neck supple.  Cardiovascular: Regular rhythm and normal heart sounds.  Tachycardia present.   Pulmonary/Chest: Effort normal and breath sounds normal. No respiratory distress. She has no wheezes. She has no rales. She exhibits no tenderness.  Abdominal: Soft. Bowel sounds are normal. There is no tenderness. There is no rebound and no guarding.  Musculoskeletal: Normal range of motion. She exhibits no edema.  Lymphadenopathy:    She has no cervical adenopathy.  Neurological: She is alert and oriented to person, place, and time.  Skin: Skin is warm and dry. No rash noted.  Psychiatric: She has a normal mood and affect. Her speech is rapid and/or pressured and tangential. She is agitated and actively hallucinating. Thought content is paranoid.    ED Course  Procedures (including critical care time) Labs Review Results for orders placed or performed during the hospital encounter of 06/10/15  Basic metabolic panel  Result Value Ref Range   Sodium 136 135 - 145 mmol/L   Potassium 3.5 3.5 - 5.1 mmol/L   Chloride 100 (L) 101 - 111 mmol/L   CO2 18 (L) 22 - 32 mmol/L   Glucose, Bld 102 (H) 65 - 99 mg/dL   BUN 21 (H) 6 - 20 mg/dL   Creatinine, Ser 4.09 (H) 0.44 - 1.00 mg/dL   Calcium 9.1 8.9 -  10.3 mg/dL   GFR calc non Af Amer >60 >60 mL/min   GFR calc Af Amer >60 >60 mL/min   Anion gap 18 (H) 5 - 15  CBC with Differential  Result Value Ref Range   WBC 7.9 4.0 - 10.5 K/uL   RBC 4.77 3.87 - 5.11 MIL/uL   Hemoglobin 12.8 12.0 - 15.0 g/dL   HCT 16.138.4 09.636.0 - 04.546.0 %   MCV 80.5 78.0 - 100.0 fL   MCH 26.8 26.0 - 34.0 pg   MCHC 33.3 30.0 - 36.0 g/dL   RDW 40.913.6 81.111.5 - 91.415.5 %   Platelets 308 150 - 400 K/uL   Neutrophils Relative % 61 %   Neutro Abs 4.8 1.7 - 7.7 K/uL   Lymphocytes Relative 22 %   Lymphs Abs 1.7 0.7 - 4.0 K/uL   Monocytes Relative 17 %   Monocytes  Absolute 1.3 (H) 0.1 - 1.0 K/uL   Eosinophils Relative 0 %   Eosinophils Absolute 0.0 0.0 - 0.7 K/uL   Basophils Relative 0 %   Basophils Absolute 0.0 0.0 - 0.1 K/uL  Ethanol  Result Value Ref Range   Alcohol, Ethyl (B) <5 <5 mg/dL  Acetaminophen level  Result Value Ref Range   Acetaminophen (Tylenol), Serum <10 (L) 10 - 30 ug/mL  Salicylate level  Result Value Ref Range   Salicylate Lvl <4.0 2.8 - 30.0 mg/dL  Urine rapid drug screen (hosp performed)  Result Value Ref Range   Opiates NONE DETECTED NONE DETECTED   Cocaine NONE DETECTED NONE DETECTED   Benzodiazepines NONE DETECTED NONE DETECTED   Amphetamines NONE DETECTED NONE DETECTED   Tetrahydrocannabinol NONE DETECTED NONE DETECTED   Barbiturates NONE DETECTED NONE DETECTED  Urinalysis, Routine w reflex microscopic  Result Value Ref Range   Color, Urine AMBER (A) YELLOW   APPearance CLOUDY (A) CLEAR   Specific Gravity, Urine 1.035 (H) 1.005 - 1.030   pH 6.0 5.0 - 8.0   Glucose, UA NEGATIVE NEGATIVE mg/dL   Hgb urine dipstick TRACE (A) NEGATIVE   Bilirubin Urine MODERATE (A) NEGATIVE   Ketones, ur >80 (A) NEGATIVE mg/dL   Protein, ur 782100 (A) NEGATIVE mg/dL   Nitrite NEGATIVE NEGATIVE   Leukocytes, UA NEGATIVE NEGATIVE  Urine microscopic-add on  Result Value Ref Range   Squamous Epithelial / LPF TOO NUMEROUS TO COUNT (A) NONE SEEN   WBC, UA 0-5 0 - 5 WBC/hpf   RBC / HPF 0-5 0 - 5 RBC/hpf   Bacteria, UA FEW (A) NONE SEEN   Casts HYALINE CASTS (A) NEGATIVE   Urine-Other MUCOUS PRESENT    No results found.    Imaging Review No results found. I have personally reviewed and evaluated these images and lab results as part of my medical decision-making.   EKG Interpretation None      MDM   Final diagnoses:  Brief psychotic disorder    On arrival, patient is trying to leave. She appears to be actively hallucinating and psychotic. IVC papers were filled out. She was given Geodon.  22:14 pt much more calm after  Geodon.  HR has normalized.  Pt appears dehydrated based on labs.  Given NS one liter.  Will place in psych hold and get TTS consult  CRITICAL CARE Performed by: Francessca Friis Total critical care time: 30 minutes Critical care time was exclusive of separately billable procedures and treating other patients. Critical care was necessary to treat or prevent imminent or life-threatening deterioration. Critical care was time spent  personally by me on the following activities: development of treatment plan with patient and/or surrogate as well as nursing, discussions with consultants, evaluation of patient's response to treatment, examination of patient, obtaining history from patient or surrogate, ordering and performing treatments and interventions, ordering and review of laboratory studies, ordering and review of radiographic studies, pulse oximetry and re-evaluation of patient's condition.   Rolan Bucco, MD 06/10/15 1610  Rolan Bucco, MD 06/10/15 2215

## 2015-06-10 NOTE — ED Notes (Addendum)
Pt. In burgundy scrubs. Pt. And belongings searched and wanded by security. Pt. Has 2 belongings bag. Pt. Has 1 pr. White tennis shoes, 1 gray virginia sweat shirt, 1 silver necklace, 1 blue pant. Pt. Belongings locked up at the nurses staion on RES A and B. Pt. Has glasses at bedside and white bra.

## 2015-06-10 NOTE — ED Notes (Signed)
Pt placed self in room after ambulating to the bathroom; pt refused to get up out of floor; pt assisted back to room via security and [employer]

## 2015-06-10 NOTE — ED Notes (Signed)
Pt wandering out of triage room anat attempting to leave department; IVC papers have been signed by Dr Fredderick PhenixBelfi; pt brought back to Res B for evaluation

## 2015-06-10 NOTE — BH Assessment (Signed)
Attempted to assess patient who was asleep. Patient given Geodon and Zofran earlier. Requested that TTS consult be removed and put back in once patient is alert and patient is able to participate in assessment. Patients nurse agreed.   Davina PokeJoVea Reonna Finlayson, LCSW Therapeutic Triage Specialist [facility] 06/10/2015 11:34 PM

## 2015-06-11 ENCOUNTER — Inpatient Hospital Stay
Admission: AD | Admit: 2015-06-11 | Discharge: 2015-06-18 | DRG: 885 | Disposition: A | Discharge status: Home / Self Care | Payer: No Typology Code available for payment source | Source: Intra-hospital | Attending: Psychiatry | Admitting: Psychiatry

## 2015-06-11 DIAGNOSIS — F203 Undifferentiated schizophrenia: Secondary | ICD-10-CM | POA: Diagnosis present

## 2015-06-11 DIAGNOSIS — F29 Unspecified psychosis not due to a substance or known physiological condition: Secondary | ICD-10-CM | POA: Diagnosis present

## 2015-06-11 DIAGNOSIS — Z880 Allergy status to penicillin: Secondary | ICD-10-CM | POA: Diagnosis not present

## 2015-06-11 DIAGNOSIS — G4700 Insomnia, unspecified: Secondary | ICD-10-CM | POA: Diagnosis present

## 2015-06-11 DIAGNOSIS — Z818 Family history of other mental and behavioral disorders: Secondary | ICD-10-CM | POA: Diagnosis not present

## 2015-06-11 DIAGNOSIS — Z9183 Wandering in diseases classified elsewhere: Secondary | ICD-10-CM | POA: Diagnosis not present

## 2015-06-11 DIAGNOSIS — F209 Schizophrenia, unspecified: Secondary | ICD-10-CM | POA: Diagnosis present

## 2015-06-11 LAB — PREGNANCY, URINE: PREG TEST UR: NEGATIVE

## 2015-06-11 MED ORDER — TRAZODONE HCL 100 MG PO TABS
100.0000 mg | ORAL_TABLET | Freq: Every evening | ORAL | Status: DC | PRN
  Administered 2015-06-11 – 2015-06-13 (×3): 100 mg via ORAL
  Filled 2015-06-11 (×3): qty 1

## 2015-06-11 MED ORDER — MAGNESIUM HYDROXIDE 400 MG/5ML PO SUSP: 30.0000 mL | Freq: Every day | ORAL | Status: DC | PRN

## 2015-06-11 MED ORDER — ALUM & MAG HYDROXIDE-SIMETH 200-200-20 MG/5ML PO SUSP: 30.0000 mL | ORAL | Status: DC | PRN

## 2015-06-11 MED ORDER — ACETAMINOPHEN 325 MG PO TABS
650.0000 mg | ORAL_TABLET | Freq: Four times a day (QID) | ORAL | Status: DC | PRN
  Administered 2015-06-16 – 2015-06-18 (×2): 650 mg via ORAL
  Filled 2015-06-11 (×2): qty 2

## 2015-06-11 MED ORDER — ACETAMINOPHEN 325 MG PO TABS: 650.0000 mg | ORAL_TABLET | Freq: Four times a day (QID) | ORAL | Status: DC | PRN

## 2015-06-11 MED ORDER — SERTRALINE HCL 50 MG PO TABS
50.0000 mg | ORAL_TABLET | Freq: Every day | ORAL | Status: DC
  Administered 2015-06-12 – 2015-06-15 (×4): 50 mg via ORAL
  Filled 2015-06-11 (×5): qty 1

## 2015-06-11 MED ORDER — SERTRALINE HCL 50 MG PO TABS
50.0000 mg | ORAL_TABLET | Freq: Every day | ORAL | Status: DC
  Administered 2015-06-11: 50 mg via ORAL
  Filled 2015-06-11: qty 1

## 2015-06-11 NOTE — BH Assessment (Signed)
[facility]   06/11/15: Case was staffed with Shaune PollackLord DNP who recommended an inpatient admission per IVC as appropriate bed placement is investigated.  [facility] Bed [facility] at 17:00 hrs.

## 2015-06-11 NOTE — ED Notes (Signed)
Pt discharged ambulatory with sheriff deputy.  All belongings were sent with patient.  Pt was calm and cooperative at discharge.

## 2015-06-11 NOTE — Progress Notes (Signed)
Patient arrived on unit at 5:35pm. Patient anxious and suspicious. Denied AVH but noted to be responding. Patient was oriented to unit and rules. She verbalized understanding. Will offer PRN medications.

## 2015-06-11 NOTE — ED Notes (Signed)
Bed: WA30 Expected date:  Expected time:  Means of arrival:  Comments: Res B 

## 2015-06-11 NOTE — Consult Note (Signed)
Order received for chaplain visit for prayer; pt refused prayer but did request opportunity to process concerns over family relationships.  Chaplain offered empathetic listening, encouragement and engaged patient in questions to consider regarding her concerns.  Patient grateful for visit and appreciative that prayer was 'not forced on her'.

## 2015-06-11 NOTE — ED Notes (Signed)
Pt oriented to room and unit.  She appears confused, asking for random people.  Pt does state she has hallucinations but will not elaborate.  She has a very flat affect and is sitting on the floor in hallway.  15 minute checks and video monitoring in place.

## 2015-06-11 NOTE — Progress Notes (Addendum)
Pt is sitting up in a chair . She stated, " I am here because I am tired. I am hurting from a lot of things. Pt is rocking back in forth in the bed and at times holds her hands out as if she is meditating. Pt is staring into the space. Pt will be transported to the [facility]. Phoned the [facility] to give report and was told they will phone back.

## 2015-06-11 NOTE — Tx Team (Addendum)
Initial Interdisciplinary Treatment Plan   PATIENT STRESSORS: Medication change or noncompliance   PATIENT STREN2GTHS: Physical Health  Supportive family   PROBLEM LIST: Problem List/Patient Goals Date to be addressed Date deferred Reason deferred Estimated date of resolution  Anxiety      Migraine Headaches                                                 DISCHARGE CRITERIA:  Adequate post-discharge living arrangements Improved stabilization in mood, thinking, and/or behavior Verbal commitment to aftercare and medication compliance  PRELIMINARY DISCHARGE PLAN: Attend aftercare/continuing care group Outpatient therapy  PATIENT/FAMIILY INVOLVEMENT: This treatment plan has been presented to and reviewed with the patient, Sarah Walton.  The patient and family have been given the opportunity to ask questions and make suggestions.  Sarah Walton 06/11/2015, 6:20 PM

## 2015-06-11 NOTE — BH Assessment (Addendum)
Assessment Note  Sarah Walton is an 40 y.o. female  that presents this date being brought in by [facility] police after patient was found wandering on campus. Patient was initially given medications on admission after patient attempted to leave ED which required EDP Fredderick Phenix MD) to write an IVC. Per EDP notes patient had been reported as a missing person which was filed yesterday. Patient does have a history of schizophrenia. Admission notes also stated patient's aunt was present on admission and reported patient has not been taking her medications. Per aunt, she's been hallucinating. Patient was a poor historian and very disorganized during assessment and was not time/place oriented. Patient denies any history of medication interventions but did state she has received services from the [place] although patient stated she cannot remember when she was there last. Patient admitted to AVH but denied any S/I or H/I. Patient did state she would see and hear "Divine Interventions" from God. Patient could not provide any collateral information or family contact numbers. Patient is alert and pleasant but is very disorganized. Patient denies any drug or alcohol use. She hasn't reported any suicidal ideations. Isaac Bliss RN stated in previous note: "Pt is not answering questions, but instead continues to speak about the evil spirits in her family. Pt sts "You can't control God, I'm sorry if he is using me to get to you but you can't control God." Pt continues to walk out of room while being spoken to. Pt also sts "I'm hearing fire alarms go off but ya'll ain't leaving." Case was staffed with Shaune Pollack DNP who recommended an inpatient admission per IVC as appropriate bed placement is investigated.  Diagnosis: Schizophrenia   Past Medical History:  Past Medical History  Diagnosis Date  . Anxiety   . History of migraine headaches     History reviewed. No pertinent past surgical history.  Family History:  Family History  Problem  Relation Age of Onset  . Anemia Mother     Social History:  reports that she has never smoked. She does not have any smokeless tobacco history on file. She reports that she does not drink alcohol or use illicit drugs.  Additional Social History:  Alcohol / Drug Use Pain Medications: See MAR Prescriptions: See MAR Over the Counter: See MAR History of alcohol / drug use?: No history of alcohol / drug abuse (pt denies any history)  CIWA: CIWA-Ar BP: 124/76 mmHg Pulse Rate: 91 COWS:    Allergies:  Allergies  Allergen Reactions  . Penicillins Other (See Comments)    Childhood Has patient had a PCN reaction causing immediate rash, facial/tongue/throat swelling, SOB or lightheadedness with hypotension: n/a Has patient had a PCN reaction causing severe rash involving mucus membranes or skin necrosis: n/a Has patient had a PCN reaction that required hospitalization: n/a Has patient had a PCN reaction occurring within the last 10 years: n/a If all of the above answers are "NO", then may proceed with Cephalosporin use.     Home Medications:  (Not in a hospital admission)  OB/GYN Status:  No LMP recorded.  General Assessment Data Location of Assessment: [facility] ED TTS Assessment: In system Is this a Tele or Face-to-Face Assessment?: Face-to-Face Is this an Initial Assessment or a Re-assessment for this encounter?: Initial Assessment Marital status: Single Maiden name: na Is patient pregnant?: No Pregnancy Status: No Living Arrangements: Alone Can pt return to current living arrangement?:  (pt states she is "unsure" pt is disorganized) Admission Status: Involuntary Is patient capable of  signing voluntary admission?: Yes Referral Source: Other (Brought in by [employer] police) Insurance type: VA insurance per patient  Medical Screening Exam [facility] Walk-in ONLY) Medical Exam completed: Yes  Crisis Care Plan Living Arrangements: Alone Legal Guardian: Other: (none) Name of Psychiatrist:   (pt states "she can't rememeber but states she has been seein) Name of Therapist: None  Education Status Is patient currently in school?: No Current Grade: na Highest grade of school patient has completed: 12 Name of school: na Contact person: na  Risk to self with the past 6 months Suicidal Ideation: No (patient denies) Has patient been a risk to self within the past 6 months prior to admission? : No Suicidal Intent: No Has patient had any suicidal intent within the past 6 months prior to admission? : No Is patient at risk for suicide?: No (pt is very disorganized on assessment) Suicidal Plan?: No Has patient had any suicidal plan within the past 6 months prior to admission? : No Access to Means: No What has been your use of drugs/alcohol within the last 12 months?: Currently denies Previous Attempts/Gestures: No (pt denies but is a poor historian) How many times?: 0 (per patient) Other Self Harm Risks: None Triggers for Past Attempts: Unknown Intentional Self Injurious Behavior: None Family Suicide History: Unknown Recent stressful life event(s): Other (Comment) (pt stated "Divine Interventions.") Persecutory voices/beliefs?: No Depression: Yes Depression Symptoms: Feeling worthless/self pity, Feeling angry/irritable Substance abuse history and/or treatment for substance abuse?: Yes Suicide prevention information given to non-admitted patients: Not applicable  Risk to Others within the past 6 months Homicidal Ideation: No Does patient have any lifetime risk of violence toward others beyond the six months prior to admission? : Unknown Thoughts of Harm to Others: No Current Homicidal Intent: No Current Homicidal Plan: No Access to Homicidal Means: No Identified Victim: na History of harm to others?: No Assessment of Violence: None Noted Violent Behavior Description: na Does patient have access to weapons?: No Criminal Charges Pending?: No Does patient have a court date:  No Is patient on probation?: No  Psychosis Hallucinations: Auditory, Visual Delusions: None noted  Mental Status Report Appearance/Hygiene: Unremarkable Eye Contact: Fair Motor Activity: Agitation Speech: Argumentative, Incoherent Level of Consciousness: Irritable Mood: Irritable Affect: Irritable Anxiety Level: Minimal Thought Processes: Irrelevant Judgement: Impaired Orientation: Not oriented Obsessive Compulsive Thoughts/Behaviors: None  Cognitive Functioning Concentration: Decreased Memory: Recent Impaired, Remote Impaired IQ: Average Insight: Poor Impulse Control: Unable to Assess Appetite: Good Weight Loss: 0 Weight Gain: 0 Sleep: Unable to Assess Total Hours of Sleep:  (UTA) Vegetative Symptoms: None  ADLScreening [facility] Assessment Services) Patient's cognitive ability adequate to safely complete daily activities?: No (per admission note pt was found wandering on [facility] campus, pt was disorganized on admission) Patient able to express need for assistance with ADLs?: Yes Independently performs ADLs?: Yes (appropriate for developmental age)  Prior Inpatient Therapy Prior Inpatient Therapy: Yes Prior Therapy Dates: 2016 [facility] Reason for Treatment: pt declined to answer  Prior Outpatient Therapy Prior Outpatient Therapy: No (per patient) Prior Therapy Dates: Unknown [facility] Reason for Treatment: Medication management Does patient have an ACCT team?: No Does patient have Intensive In-House Services?  : No Does patient have [facility] services? : No Does patient have P4CC services?: No  ADL Screening (condition at time of admission) Patient's cognitive ability adequate to safely complete daily activities?: No (per admission note pt was found wandering on [facility] campus, pt was disorganized on admission) Is the  patient deaf or have difficulty hearing?: No Does the patient have difficulty  seeing, even when wearing glasses/contacts?: No Does the patient have difficulty concentrating, remembering, or making decisions?: Yes (pt admitts to not being able to "think straight.") Patient able to express need for assistance with ADLs?: Yes Does the patient have difficulty dressing or bathing?: No Independently performs ADLs?: Yes (appropriate for developmental age) Does the patient have difficulty walking or climbing stairs?: No Weakness of Legs: None Weakness of Arms/Hands: None  Home Assistive Devices/Equipment Home Assistive Devices/Equipment: None  Therapy Consults (therapy consults require a physician order) PT Evaluation Needed: No OT Evalulation Needed: No SLP Evaluation Needed: No Abuse/Neglect Assessment (Assessment to be complete while patient is alone) Physical Abuse: Denies (pt denies although was disorganized on assessment) Verbal Abuse: Denies Sexual Abuse: Denies Exploitation of patient/patient's resources: Denies Self-Neglect: Denies Values / Beliefs Cultural Requests During Hospitalization: None Spiritual Requests During Hospitalization: None Consults Spiritual Care Consult Needed: No Social Work Consult Needed: No Merchant navy officerAdvance Directives (For Healthcare) Does patient have an advance directive?: No Would patient like information on creating an advanced directive?: No - patient declined information (pt declined stating "Only god can help with that.")    Additional Information 1:1 In Past 12 Months?: No CIRT Risk: No Elopement Risk: No Does patient have medical clearance?: Yes     Disposition: Case was staffed with Shaune PollackLord DNP who recommended an inpatient admission per IVC as appropriate bed placement is investigated.  Disposition Initial Assessment Completed for this Encounter: Yes Disposition of Patient: Inpatient treatment program Type of inpatient treatment program: Adult  On Site Evaluation by:   Reviewed with Physician:    Alfredia Fergusonavid L  Chrislyn Seedorf 06/11/2015 11:55 AM

## 2015-06-11 NOTE — BH Assessment (Addendum)
Patient has been accepted [facility].  Accepting Physician is Dr. Ardyth HarpsHernandez.  Attending Physician will be Dr. Ardyth HarpsHernandez.  Patient [facility] 313[facility] Sue LushAndrea.  Call report to [phone].  Representative/Transfer Coordinator is Nicola Girtalvin  [facility] ER Staff Maisie Fus(Thomas, TTS) made aware of acceptance. [facility] Staff Inetta Fermo([facility] made aware of acceptance    Bed available after 5pm

## 2015-06-11 NOTE — BHH Group Notes (Signed)
B[facility] Group Notes:  (Nursing/MHT/Case Management/Adjunct)  Date:  06/11/2015  Time:  10:18 PM  Type of Therapy:  Evening Wrap-up Group  Participation Level:  Active  Participation Quality:  Intrusive  Affect:  Labile  Cognitive:  Disorganized  Insight:  Limited  Engagement in Group:  Engaged and Off Topic  Modes of Intervention:  Discussion  Summary of Progress/Problems:  Tomasita MorrowChelsea Nanta Margi Edmundson 06/11/2015, 10:18 PM

## 2015-06-11 NOTE — BH Assessment (Signed)
BHH Assessment Progress Note  Per Thedore MinsMojeed Akintayo, MD, this pt requires psychiatric hospitalization at this time.  Per Robinette Hainesalvin Manning, pt has been accepted to [facility], Rm [facility] by Dr Ardyth HarpsHernandez.  They will be ready to receive pt at 17:00.  Nanine MeansJamison Lord, DNP concurs with this decision.  Pt presents under IVC initiated by EDP Rolan BuccoMelanie Belfi, MD, and IVC documents have been faxed to [phone].  Pt's nurse, Kendal Hymendie, has been notified, and agrees to call report to [phone].  [facility]  Doylene Canninghomas Aviance [place], MA Triage Specialist [phone]

## 2015-06-12 DIAGNOSIS — F209 Schizophrenia, unspecified: Secondary | ICD-10-CM

## 2015-06-12 MED ORDER — RISPERIDONE 1 MG PO TABS
1.0000 mg | ORAL_TABLET | Freq: Two times a day (BID) | ORAL | Status: DC
  Administered 2015-06-12 – 2015-06-13 (×3): 1 mg via ORAL
  Filled 2015-06-12 (×4): qty 1

## 2015-06-12 MED ORDER — DIPHENHYDRAMINE HCL 25 MG PO CAPS
25.0000 mg | ORAL_CAPSULE | Freq: Four times a day (QID) | ORAL | Status: DC | PRN
  Administered 2015-06-12 (×2): 25 mg via ORAL
  Filled 2015-06-12 (×2): qty 1

## 2015-06-12 MED ORDER — DIPHENHYDRAMINE HCL 50 MG/ML IJ SOLN: 25.0000 mg | Freq: Four times a day (QID) | INTRAMUSCULAR | Status: DC | PRN

## 2015-06-12 MED ORDER — HALOPERIDOL LACTATE 5 MG/ML IJ SOLN: 5.0000 mg | Freq: Four times a day (QID) | INTRAMUSCULAR | Status: DC | PRN

## 2015-06-12 MED ORDER — HALOPERIDOL 5 MG PO TABS
5.0000 mg | ORAL_TABLET | Freq: Three times a day (TID) | ORAL | Status: DC | PRN
  Administered 2015-06-12: 5 mg via ORAL
  Filled 2015-06-12: qty 1

## 2015-06-12 NOTE — H&P (Signed)
Psychiatric Admission Assessment Adult  Patient Identification: Sarah Walton MRN:  [id number] Date of Evaluation:  06/12/2015 Chief Complaint:  schizophrenia Principal Diagnosis: <principal problem not specified> Diagnosis:   Patient Active Problem List   Diagnosis Date Noted  . 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 Schizophrenia ([place]) [F20.9] 06/11/2015  . Psychosis [F29] 06/11/2015   History of Present Illness:    40 year old with schizophrenia admitted after being found wandering on [address]. Patient is disorganized and difficult to follow today. She states that she is here because "I was tired. Eerything from "lotteries to everything being one sided, to me coughing like crazy.  Being sick and throwing up my hands."   She states that she can't remember when all of this started and then told a story about everyone thinking it's weird that she got her apartment back. She perseverated on this several times and related everything to her being amazed "that I got the exact same apartment back." Patient denied mental illness and states that she only has anxiety. She uses spiritual meditation to clear her head.  She was taking "Nerve Blend 14 to relax you passion flower and valerian flower. That's probably what my spirit was picking up. "    States that she was a liitle combative and belligerent because "there are people in your families messing with folks, controlling their thoughts and what they do."  She denied SI/HI.AVH but appears to respond to internal stimuli. Her shirt is on inside out.   She did not attend groups.   Per nursing:  Pt has been pleasant and cooperative but confused and disorganized. Pt needs frequent redirecting. Pt denies SI and A/V hallucinations but does appear to be responding to internal stimuli at times.   Patient arrived on unit at 5:35pm. Patient anxious and suspicious. Denied AVH but noted to be responding. Patient was oriented to unit  and rules. She verbalized understanding. Will offer PRN medications.       Per assessment note: Assessment Note  Sarah Walton is an 40 y.o. female that presents this date being brought in by [facility] police after patient was found wandering on campus. Patient was initially given medications on admission after patient attempted to leave ED which required EDP Tamera Punt MD) to write an IVC. Per EDP notes patient had been reported as a missing person which was filed yesterday. Patient does have a history of schizophrenia. Admission notes also stated patient's aunt was present on admission and reported patient has not been taking her medications. Per aunt, she's been hallucinating. Patient was a poor historian and very disorganized during assessment and was not time/place oriented. Patient denies any history of medication interventions but did state she has received services from the [place] although patient stated she cannot remember when she was there last. Patient admitted to [place] but denied any S/I or H/I. Patient did state she would see and hear "Divine Interventions" from God. Patient could not provide any collateral information or family contact numbers. Patient is alert and pleasant but is very disorganized. Patient denies any drug or alcohol use. She hasn't reported any suicidal ideations. Marian Sorrow RN stated in previous note: "Pt is not answering questions, but instead continues to speak about the evil spirits in her family. Pt sts "You can't control God, I'm sorry if he is using me to get to you but you can't control God." Pt continues to walk out of room while being spoken to. Pt also sts "I'm hearing fire  alarms go off but ya'll ain't leaving." Case was staffed with Reita Cliche DNP who recommended an inpatient admission per IVC as appropriate bed placement is investigated.   Associated Signs/Symptoms: Depression Symptoms:  pt denies any issues (Hypo) Manic Symptoms:  Distractibility, Impulsivity, Anxiety Symptoms:   denies Psychotic Symptoms:  Hallucinations: she denies but appears to be responding to internal stimuli PTSD Symptoms: Negative Total Time spent with patient: 50 mins  Past Psychiatric History: anxiety, per patient. Schizophrenia per chart  Is the patient at risk to self? No.  Has the patient been a risk to self in the past 6 months? No.  Has the patient been a risk to self within the distant past? No.  Is the patient a risk to others? No.  Has the patient been a risk to others in the past 6 months? No.  Has the patient been a risk to others within the distant past? No.   Prior Inpatient Therapy:   Prior Outpatient Therapy:    Alcohol Screening: 1. How often do you have a drink containing alcohol?: Never 9. Have you or someone else been injured as a result of your drinking?: No 10. Has a relative or friend or a doctor or another health worker been concerned about your drinking or suggested you cut down?: No Alcohol Use Disorder Identification Test Final Score (AUDIT): 0 Brief Intervention: Patient declined brief intervention Substance Abuse History in the last 12 months:  No. Consequences of Substance Abuse: NA Previous Psychotropic Medications: see home med list Psychological Evaluations: Yes  Past Medical History:  Past Medical History  Diagnosis Date  . Anxiety   . History of migraine headaches    History reviewed. No pertinent past surgical history. Family History:  Family History  Problem Relation Age of Onset  . Anemia Mother    Family Psychiatric  History: Mom is "schizophrenic and bipolar."  Tobacco Screening: @FLOW ([phone])::1)@ Social History:  History  Alcohol Use No     History  Drug Use No    Additional Social History:                           Allergies:   Allergies  Allergen Reactions  . Penicillins Other (See Comments)    Childhood Has patient had a PCN reaction causing immediate rash, facial/tongue/throat swelling, SOB or  lightheadedness with hypotension: n/a Has patient had a PCN reaction causing severe rash involving mucus membranes or skin necrosis: n/a Has patient had a PCN reaction that required hospitalization: n/a Has patient had a PCN reaction occurring within the last 10 years: n/a If all of the above answers are "NO", then may proceed with Cephalosporin use.    Lab Results:  Results for orders placed or performed during the hospital encounter of 06/10/15 (from the past 48 hour(s))  Urine rapid drug screen (hosp performed)     Status: None   Collection Time: 06/10/15  8:41 PM  Result Value Ref Range   Opiates NONE DETECTED NONE DETECTED   Cocaine NONE DETECTED NONE DETECTED   Benzodiazepines NONE DETECTED NONE DETECTED   Amphetamines NONE DETECTED NONE DETECTED   Tetrahydrocannabinol NONE DETECTED NONE DETECTED   Barbiturates NONE DETECTED NONE DETECTED    Comment:        DRUG SCREEN FOR MEDICAL PURPOSES ONLY.  IF CONFIRMATION IS NEEDED FOR ANY PURPOSE, NOTIFY LAB WITHIN 5 DAYS.        LOWEST DETECTABLE LIMITS FOR URINE DRUG SCREEN Drug Class  Cutoff (ng/mL) Amphetamine      1000 Barbiturate      200 Benzodiazepine   979 Tricyclics       892 Opiates          300 Cocaine          300 THC              50   Urinalysis, Routine w reflex microscopic     Status: Abnormal   Collection Time: 06/10/15  8:41 PM  Result Value Ref Range   Color, Urine AMBER (A) YELLOW    Comment: BIOCHEMICALS MAY BE AFFECTED BY COLOR   APPearance CLOUDY (A) CLEAR   Specific Gravity, Urine 1.035 (H) 1.005 - 1.030   pH 6.0 5.0 - 8.0   Glucose, UA NEGATIVE NEGATIVE mg/dL   Hgb urine dipstick TRACE (A) NEGATIVE   Bilirubin Urine MODERATE (A) NEGATIVE   Ketones, ur >80 (A) NEGATIVE mg/dL   Protein, ur 100 (A) NEGATIVE mg/dL   Nitrite NEGATIVE NEGATIVE   Leukocytes, UA NEGATIVE NEGATIVE  Urine microscopic-add on     Status: Abnormal   Collection Time: 06/10/15  8:41 PM  Result Value Ref Range    Squamous Epithelial / LPF TOO NUMEROUS TO COUNT (A) NONE SEEN   WBC, UA 0-5 0 - 5 WBC/hpf   RBC / HPF 0-5 0 - 5 RBC/hpf   Bacteria, UA FEW (A) NONE SEEN   Casts HYALINE CASTS (A) NEGATIVE   Urine-Other MUCOUS PRESENT   Pregnancy, urine     Status: None   Collection Time: 06/10/15  8:41 PM  Result Value Ref Range   Preg Test, Ur NEGATIVE NEGATIVE    Comment:        THE SENSITIVITY OF THIS METHODOLOGY IS >20 mIU/mL.   Basic metabolic panel     Status: Abnormal   Collection Time: 06/10/15  8:52 PM  Result Value Ref Range   Sodium 136 135 - 145 mmol/L   Potassium 3.5 3.5 - 5.1 mmol/L   Chloride 100 (L) 101 - 111 mmol/L   CO2 18 (L) 22 - 32 mmol/L   Glucose, Bld 102 (H) 65 - 99 mg/dL   BUN 21 (H) 6 - 20 mg/dL   Creatinine, Ser 1.02 (H) 0.44 - 1.00 mg/dL   Calcium 9.1 8.9 - 10.3 mg/dL   GFR calc non Af Amer >60 >60 mL/min   GFR calc Af Amer >60 >60 mL/min    Comment: (NOTE) The eGFR has been calculated using the CKD EPI equation. This calculation has not been validated in all clinical situations. eGFR's persistently <60 mL/min signify possible Chronic Kidney Disease.    Anion gap 18 (H) 5 - 15  CBC with Differential     Status: Abnormal   Collection Time: 06/10/15  8:52 PM  Result Value Ref Range   WBC 7.9 4.0 - 10.5 K/uL   RBC 4.77 3.87 - 5.11 MIL/uL   Hemoglobin 12.8 12.0 - 15.0 g/dL   HCT 38.4 36.0 - 46.0 %   MCV 80.5 78.0 - 100.0 fL   MCH 26.8 26.0 - 34.0 pg   MCHC 33.3 30.0 - 36.0 g/dL   RDW 13.6 11.5 - 15.5 %   Platelets 308 150 - 400 K/uL   Neutrophils Relative % 61 %   Neutro Abs 4.8 1.7 - 7.7 K/uL   Lymphocytes Relative 22 %   Lymphs Abs 1.7 0.7 - 4.0 K/uL   Monocytes Relative 17 %   Monocytes Absolute 1.3 (  H) 0.1 - 1.0 K/uL   Eosinophils Relative 0 %   Eosinophils Absolute 0.0 0.0 - 0.7 K/uL   Basophils Relative 0 %   Basophils Absolute 0.0 0.0 - 0.1 K/uL  Ethanol     Status: None   Collection Time: 06/10/15  8:52 PM  Result Value Ref Range   Alcohol,  Ethyl (B) <5 <5 mg/dL    Comment:        LOWEST DETECTABLE LIMIT FOR SERUM ALCOHOL IS 5 mg/dL FOR MEDICAL PURPOSES ONLY   Acetaminophen level     Status: Abnormal   Collection Time: 06/10/15  8:52 PM  Result Value Ref Range   Acetaminophen (Tylenol), Serum <10 (L) 10 - 30 ug/mL    Comment:        THERAPEUTIC CONCENTRATIONS VARY SIGNIFICANTLY. A RANGE OF 10-30 ug/mL MAY BE AN EFFECTIVE CONCENTRATION FOR MANY PATIENTS. HOWEVER, SOME ARE BEST TREATED AT CONCENTRATIONS OUTSIDE THIS RANGE. ACETAMINOPHEN CONCENTRATIONS >150 ug/mL AT 4 HOURS AFTER INGESTION AND >50 ug/mL AT 12 HOURS AFTER INGESTION ARE OFTEN ASSOCIATED WITH TOXIC REACTIONS.   Salicylate level     Status: None   Collection Time: 06/10/15  8:52 PM  Result Value Ref Range   Salicylate Lvl <1.6 2.8 - 30.0 mg/dL    Blood Alcohol level:  Lab Results  Component Value Date   ETH <5 07/37/1062    Metabolic Disorder Labs:  No results found for: HGBA1C, MPG No results found for: PROLACTIN No results found for: CHOL, TRIG, HDL, CHOLHDL, VLDL, LDLCALC  Current Medications: Current Facility-Administered Medications  Medication Dose Route Frequency Provider Last Rate Last Dose  . acetaminophen (TYLENOL) tablet 650 mg  650 mg Oral Q6H PRN Patrecia Pour, NP      . alum & mag hydroxide-simeth (MAALOX/MYLANTA) 200-200-20 MG/5ML suspension 30 mL  30 mL Oral Q4H PRN Hildred Priest, MD      . magnesium hydroxide (MILK OF MAGNESIA) suspension 30 mL  30 mL Oral Daily PRN Hildred Priest, MD      . sertraline (ZOLOFT) tablet 50 mg  50 mg Oral Daily Patrecia Pour, NP   50 mg at 06/12/15 0911  . traZODone (DESYREL) tablet 100 mg  100 mg Oral QHS PRN Hildred Priest, MD   100 mg at 06/11/15 2211   PTA Medications: No prescriptions prior to admission    Musculoskeletal: Strength & Muscle Tone: within normal limits Gait & Station: normal Patient leans: N/A  Psychiatric Specialty Exam: Physical  Exam  ROS  Blood pressure 129/79, pulse 106, temperature 98.6 F (37 C), temperature source Oral, resp. rate 18, height 5' (1.524 m), weight 73.936 kg (163 lb), SpO2 100 %.Body mass index is 31.83 kg/(m^2).  General Appearance: Bizarre and Disheveled, sweatshirt inside out  Eye Contact:  Fair  Speech:  Normal Rate  Volume:  Normal  Mood:  Anxious and Irritable  Affect:  Inappropriate  Thought Process:  Disorganized  Orientation:  Full (Time, Place, and Person)  Thought Content:  Illogical, Paranoid Ideation and Rumination  Suicidal Thoughts:  No  Homicidal Thoughts:  No  Memory:  Immediate;   Fair Recent;   Poor Remote;   Fair  Judgement:  Impaired  Insight:  Lacking and Shallow  Psychomotor Activity:  Increased  Concentration:  Concentration: Fair  Recall:  [place] of Knowledge:  Poor  Language:  Fair  Akathisia:  No  Handed:    AIMS (if indicated):     Assets:  Desire for Improvement Housing Physical  Health  ADL's:  Intact  Cognition:  Impaired,  Mild  Sleep:  Number of Hours: 73.29     40 year old disorganized with schizophrenia. Patient is poor historian due to disorganized thought process. She appears to respond to internal stimuli but denies this. She is paranoid about her mediations but agreed to take them today. Denies HI/SI/AVH.   Treatment Plan Summary: Daily contact with patient to assess and evaluate symptoms and progress in treatment and Medication management  Observation Level/Precautions:  Elopement Continuous Observation  Laboratory:  as ordered  Psychotherapy:  Milieu, groups  Medications:  Risperdal 1 mg PO BID Haldol 5 mg PO/IM q6-8 ordered for agitation. If patient requires several prn doses, will adjust main medication to address needs Zoloft/Trazodone as ordered  Consultations:    Discharge Concerns:  Housing, social support  Estimated LOS: 5-7 days  Other:     I certify that inpatient services furnished can reasonably be expected to  improve the patient's condition.    Jim Like, MD 5/27/201710:59 AM

## 2015-06-12 NOTE — Progress Notes (Addendum)
D: Pt switched rooms without prior permission to room 25. Staff allowed her to stay to avoid power struggle. Pt intrusive and labile during evening group but able to be redirected. Pt disorganized. Inquired "where is Bill clinton, when is he getting here"? Pt paranoid and guarded. Disoriented to situation. Medication compliant. [facility]. Denies SI/AVH but appears to be responding to internal stimuli. Needed redirection to not sit on floor in hallway. A: Encouragement and support provided. Q15 minute checks maintained for safety. Medications given as prescribed.  R: Remains safe on unit. Voices no additional concerns at this time. Will continue to monitor.

## 2015-06-12 NOTE — [facility] Group Notes (Signed)
[facility] LCSW Group Therapy  06/12/2015 2:00 PM  Type of Therapy:  Group Therapy  Participation Level:  Did Not Attend  Modes of Intervention:  Discussion, Education, Socialization and Support  Summary of Progress/Problems: Todays topic: Grudges  Patients will be encouraged to discuss their thoughts, feelings, and behaviors as to why one holds on to grudges and reasons why people have grudges. Patients will process the impact of grudges on their daily lives and identify thoughts and feelings related to holding grudges. Patients will identify feelings and thoughts related to what life would look like without grudges.   Melicia Esqueda L Azucena Dart MSW, LCSWA  06/12/2015, 2:00 PM   

## 2015-06-12 NOTE — [facility] Suicide Risk Assessment (Signed)
[facility]   Nursing information obtained from:    patient Demographic factors:    female black Current Mental Status:    Loss Factors:     Historical Factors:     Risk Reduction Factors:    physical health  Total Time spent with patient: 1 hour Principal Problem: <principal problem not specified> Diagnosis:   Patient Active Problem List   Diagnosis Date Noted  . 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 Schizophrenia ([facility]) [F20.9] 06/11/2015  . Psychosis [F29] 06/11/2015   Subjective Data: see HP  Continued Clinical Symptoms:  Alcohol Use Disorder Identification Test Final Score (AUDIT): 0 The "Alcohol Use Disorders Identification Test", Guidelines for Use in Primary Care, Second Edition.  World Science writerHealth Organization [facility](WHO). Score between 0-7:  no or low risk or alcohol related problems. Score between 8-15:  moderate risk of alcohol related problems. Score between 16-19:  high risk of alcohol related problems. Score 20 or above:  warrants further diagnostic evaluation for alcohol dependence and treatment.   CLINICAL FACTORS:   Schizophrenia:   Paranoid or undifferentiated type   Musculoskeletal: Strength & Muscle Tone: within normal limits Gait & Station: normal Patient leans: N/A  Psychiatric Specialty Exam: Physical Exam  Nursing note and vitals reviewed. Constitutional: She appears well-developed and well-nourished.  HENT:  Head: Normocephalic.  Neck: Normal range of motion. Neck supple.  Cardiovascular: Normal rate.   Respiratory: Effort normal.  GI: Soft.  Musculoskeletal: Normal range of motion.  Neurological: She is alert.  Skin: Skin is warm.    Review of Systems  Constitutional: Negative.   HENT: Negative.   Respiratory: Negative.   Cardiovascular: Negative.   Gastrointestinal: Negative.   Musculoskeletal: Negative.   Skin: Negative.   Neurological: Negative.   Endo/Heme/Allergies: Negative.    Psychiatric/Behavioral: The patient is nervous/anxious and has insomnia.   All other systems reviewed and are negative.   Blood pressure 129/79, pulse 106, temperature 98.6 F (37 C), temperature source Oral, resp. rate 18, height 5' (1.524 m), weight 73.936 kg (163 lb), SpO2 100 %.Body mass index is 31.83 kg/(m^2).  General Appearance: Bizarre  Eye Contact:  Fair  Speech:  Normal Rate  Volume:  Normal  Mood:  Anxious and Irritable  Affect:  Inappropriate  Thought Process:  Disorganized  Orientation:  Full (Time, Place, and Person)  Thought Content:  Illogical, Paranoid Ideation and Rumination  Suicidal Thoughts:  No  Homicidal Thoughts:  No  Memory:  Immediate;   Fair Recent;   Poor Remote;   Poor  Judgement:  Impaired  Insight:  Lacking and Shallow  Psychomotor Activity:  Increased  Concentration:  Concentration: Fair  Recall:  Fair  Fund of Knowledge:  Poor  Language:  Fair  Akathisia:  No  Handed:    AIMS (if indicated):     Assets:  Desire for Improvement Housing Physical Health  ADL's:  Intact  Cognition:  WNL  Sleep:  Number of Hours: 3.3      COGNITIVE FEATURES THAT CONTRIBUTE TO RISK:  Closed-mindedness    SUICIDE RISK:   Minimal: No identifiable suicidal ideation.  Patients presenting with no risk factors but with morbid ruminations; may be classified as minimal risk based on the severity of the depressive symptoms  [facility]treat  I certify that inpatient services furnished can reasonably be expected to improve the patient's condition.   Lockie ParesBell,  Senie Lanese L, MD 06/12/2015, 9:37 PM

## 2015-06-12 NOTE — Progress Notes (Signed)
Pt has been pleasant and cooperative but confused and disorganized. Pt needs frequent redirecting. Pt denies SI and A/V hallucinations but does appear to be responding to internal stimuli at times.

## 2015-06-13 MED ORDER — LORATADINE 10 MG PO TABS
10.0000 mg | ORAL_TABLET | Freq: Every day | ORAL | Status: DC
  Administered 2015-06-13 – 2015-06-18 (×6): 10 mg via ORAL
  Filled 2015-06-13 (×6): qty 1

## 2015-06-13 MED ORDER — RISPERIDONE 1 MG PO TABS
2.0000 mg | ORAL_TABLET | Freq: Two times a day (BID) | ORAL | Status: DC
  Administered 2015-06-13 – 2015-06-14 (×2): 2 mg via ORAL
  Filled 2015-06-13 (×2): qty 2

## 2015-06-13 NOTE — [facility] Group Notes (Signed)
[facility] Group Notes:  (Nursing/MHT/Case Management/Adjunct)  Date:  06/13/2015  Time:  1:41 AM  Type of Therapy:  Group Therapy  Participation Level:  Did Not Attend   Summary of Progress/Problems:  Sarah Walton 06/13/2015, 1:41 AM

## 2015-06-13 NOTE — Progress Notes (Signed)
Pt has been pleasant and cooperative. Pt denies having  passive thoughts of SI and A/ hallucinations. Pt is able to contract for safety. Pt has been less seclusive to her room. Pt's mood and affect has been depressed. Pt states she "feels tried ". Pt c/o feeling congested. Pt has been more active on the unit.

## 2015-06-13 NOTE — Progress Notes (Signed)
[facility] MD Progress Note  06/13/2015 6:03 PM Sarah Walton  MRN:  [id number]  This is a follow up of Sarah Walton on 06/13/2015    Subjective:   Patient initially was not responding verbally to most questions but nods or shakes head. Pt shook head for SI/HI/AVH. She states that she is not angry "just tired." She went on to say that "there is too much going on and I can't see what is going on... But it's something. You know what I mean?" Appears to be responding to internal stimuli.   Per nursing,  D: Pt denies SI/HI/AVH, but noted responding to internal stimuli. Pt is pleasant and cooperative, affect is flat. Patient's thoughts are disorganized and illogical. Pt appears less anxious and she is not interacting with peers and staff appropriately.  A: Pt was offered support and encouragement. Pt was given scheduled medications. Pt was encouraged to attend groups. Q 15 minute checks were done for safety.  R:Pt did not attend group. Pt is taking medication. Pt has no complaints.Pt receptive to treatment and safety maintained on unit.   Principal Problem: Schizophrenia (HCC) Diagnosis:   Patient Active Problem List   Diagnosis Date Noted  . 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 Schizophrenia (HCC) [F20.9] 06/11/2015  . Psychosis [F29] 06/11/2015   Total Time spent with patient: 30 minutes  Past Psychiatric History: schizophrenia  Past Medical History:  Past Medical History  Diagnosis Date  . Anxiety   . History of migraine headaches    History reviewed. No pertinent past surgical history. Family History:  Family History  Problem Relation Age of Onset  . Anemia Mother    Family Psychiatric  History:  Schizophrenic and bipolar Social History:  History  Alcohol Use No     History  Drug Use No    Social History   Social History  . Marital Status: Single    Spouse Name: N/A  . Number of Children: N/A  . Years of Education: N/A   Social History Main  Topics  . Smoking status: Never Smoker   . Smokeless tobacco: None  . Alcohol Use: No  . Drug Use: No  . Sexual Activity: Not Asked   Other Topics Concern  . None   Social History Narrative   Additional Social History:                         Sleep: Fair  Appetite:  Fair  Current Medications: Current Facility-Administered Medications  Medication Dose Route Frequency Provider Last Rate Last Dose  . acetaminophen (TYLENOL) tablet 650 mg  650 mg Oral Q6H PRN Charm Rings, NP      . alum & mag hydroxide-simeth (MAALOX/MYLANTA) 200-200-20 MG/5ML suspension 30 mL  30 mL Oral Q4H PRN Jimmy Footman, MD      . diphenhydrAMINE (BENADRYL) capsule 25 mg  25 mg Oral Q6H PRN Lockie Pares, MD   25 mg at 06/12/15 2218   Or  . diphenhydrAMINE (BENADRYL) injection 25 mg  25 mg Intramuscular Q6H PRN Arly Salminen L Rashiya Lofland, MD      . haloperidol (HALDOL) tablet 5 mg  5 mg Oral Q8H PRN Dellanira Dillow L Ezelle Surprenant, MD   5 mg at 06/12/15 1510   Or  . haloperidol lactate (HALDOL) injection 5 mg  5 mg Intramuscular Q6H PRN Monna Crean L Burma Ketcher, MD      . loratadine (CLARITIN) tablet 10 mg  10 mg Oral Daily Verdun Rackley L  Jeyren Danowski, MD   10 mg at 06/13/15 1154  . magnesium hydroxide (MILK OF MAGNESIA) suspension 30 mL  30 mL Oral Daily PRN Jimmy FootmanAndrea Hernandez-Gonzalez, MD      . risperiDONE (RISPERDAL) tablet 1 mg  1 mg Oral BID Lockie Paresiffani L Tamalyn Wadsworth, MD   1 mg at 06/13/15 0913  . sertraline (ZOLOFT) tablet 50 mg  50 mg Oral Daily Charm RingsJamison Y Lord, NP   50 mg at 06/13/15 0912  . traZODone (DESYREL) tablet 100 mg  100 mg Oral QHS PRN Jimmy FootmanAndrea Hernandez-Gonzalez, MD   100 mg at 06/12/15 2218    Lab Results: No results found for this or any previous visit (from the past 48 hour(s)).  Blood Alcohol level:  Lab Results  Component Value Date   ETH <5 06/10/2015    Physical Findings: AIMS:  , ,  ,  ,    CIWA:    COWS:     Musculoskeletal: Strength & Muscle Tone: within normal limits Gait & Station: normal Patient leans:  N/A  Psychiatric Specialty Exam: Physical Exam  ROS  Blood pressure 131/69, pulse 94, temperature 98 F (36.7 C), temperature source Oral, resp. rate 18, height 5' (1.524 m), weight 73.936 kg (163 lb), SpO2 100 %.Body mass index is 31.83 kg/(m^2).  General Appearance: Bizarre and Disheveled  Eye Contact:  Fair  Speech:  Blocked  Volume:  Normal  Mood:  Anxious and Irritable  Affect:  Inappropriate  Thought Process:  Disorganized and Irrelevant  Orientation:  Full (Time, Place, and Person)  Thought Content:  Illogical, Delusions, Paranoid Ideation and Rumination  Suicidal Thoughts:  No  Homicidal Thoughts:  No  Memory:  Immediate;   Fair Recent;   Fair Remote;   Fair  Judgement:  Impaired  Insight:  Lacking  Psychomotor Activity:  Decreased  Concentration:  Concentration: Poor  Recall:  Fair  Fund of Knowledge:  Poor  Language:  Fair  Akathisia:  No  Handed:    AIMS (if indicated):     Assets:  Desire for Improvement Housing Physical Health  ADL's:  Intact  Cognition:  Impaired,  Mild  Sleep:  Number of Hours: 7.75     Treatment Plan Summary: Daily contact with patient to assess and evaluate symptoms and progress in treatment and Medication management   40 year old disorganized with schizophrenia. Patient is poor historian due to disorganized thought process. She appears to respond to internal stimuli but denies this. She is paranoid about her mediations but agreed to take them today. Denies HI/SI/AVH.   Treatment Plan Summary: Daily contact with patient to assess and evaluate symptoms and progress in treatment and Medication management  Observation Level/Precautions: Elopement Continuous Observation  Laboratory: as ordered  Psychotherapy: Milieu, groups  Medications: Risperdal 2 mg PO BID Haldol 5 mg PO/IM q6-8 ordered for agitation. If patient requires several prn doses, will adjust main medication to address needs Zoloft/Trazodone as  ordered Benadryl for EPS  Consultations:   Discharge Concerns: Housing, social support  Estimated LOS: 5-7 days  Other:        3/28: Will increase Risperdal to address continue psychosis. Patient required prn of Haldol Saturday at 3 pm.   Lockie ParesBell,  Clarisa Danser L, MD 06/13/2015, 6:03 PM

## 2015-06-13 NOTE — Progress Notes (Signed)
D: Pt denies SI/HI/AVH, but noted responding to internal stimuli. Pt is pleasant and cooperative, affect is flat. Patient's thoughts are disorganized and illogical. Pt appears less anxious and she is not  interacting with peers and staff appropriately.  A: Pt was offered support and encouragement. Pt was given scheduled medications. Pt was encouraged to attend groups. Q 15 minute checks were done for safety.  R:Pt did not attend group. Pt is taking medication. Pt has no complaints.Pt receptive to treatment and safety maintained on unit.

## 2015-06-13 NOTE — [facility] Counselor (Signed)
Adult Comprehensive Assessment  Patient ID: Sarah HawthorneYolanda M Walton, female   DOB: 1975-11-06, 40 y.o.   MRN: [id number]  Information Source: Information source: Patient  Current Stressors:  Educational / Learning stressors: None reported  Employment / Job issues: Pt is employed.  Family Relationships: "My mom's behaviors have changed. She takes off now and put on make up. She is getting aggressive."  Financial / Lack of resources (include bankruptcy): Limited income.  Housing / Lack of housing: Pt moved back into her apartment. She reports "the interactions arent the same." Physical health (include injuries & life threatening diseases): None reported  Social relationships: None reported  Substance abuse: Denies use.  Bereavement / Loss: None reported   Living/Environment/Situation:  Living Arrangements: Parent Living conditions (as described by patient or guardian): Pt reports she her mother lives with her.  How long has patient lived in current situation?: less than a month.  What is atmosphere in current home: Dangerous  Family History:  Marital status: Single Are you sexually active?: No What is your sexual orientation?: refused to answer  Has your sexual activity been affected by drugs, alcohol, medication, or emotional stress?: None reported  Does patient have children?: No  Childhood History:  By whom was/is the patient raised?: Mother Description of patient's relationship with caregiver when they were a child: ok relationship with mother  Patient's description of current relationship with people who raised him/her: Pt reports her mother has schizophernia. She states "her behaviors have changed. I felt it was aggressive. She takes off and wears make up."  How were you disciplined when you got in trouble as a child/adolescent?: None reported  Does patient have siblings?: No Did patient suffer any verbal/emotional/physical/sexual abuse as a child?: No ("I think everyone has" ) Did  patient suffer from severe childhood neglect?: No Has patient ever been sexually abused/assaulted/raped as an adolescent or adult?: No Was the patient ever a victim of a crime or a disaster?: No Witnessed domestic violence?: No Has patient been effected by domestic violence as an adult?: No  Education:  Highest grade of school patient has completed: Associates degree  Currently a student?: No Learning disability?: No  Employment/Work Situation:   Employment situation: Employed ("I dont think I am on disability" ) Where is patient currently employed?: Engineer, siteMedical assistant  How long has patient been employed?: Dec. 2016 Patient's job has been impacted by current illness: No What is the longest time patient has a held a job?: 7 years  Where was the patient employed at that time?: [employer]  Has patient ever been in the [employer] and Companymilitary?: No  Financial Resources:   Surveyor, quantityinancial resources: Income from employment Does patient have a representative payee or guardian?: Yes Name of representative payee or guardian: Pt reports she had a guardian in the past.   Alcohol/Substance Abuse:   What has been your use of drugs/alcohol within the last 12 months?: denies use.  Alcohol/Substance Abuse Treatment Hx: Denies past history Has alcohol/substance abuse ever caused legal problems?: No  Social Support System:   Patient's Community Support System: [employer] Museum/gallery exhibitions officerDescribe Community Support System: family  Type of faith/religion: Baptist  How does patient's faith help to cope with current illness?: prayer   Leisure/Recreation:   Leisure and Hobbies: reading, tv and radio   Strengths/Needs:   What things does the patient do well?: taking care of patients  In what areas does patient struggle / problems for patient: Pt was unable to answer.   Discharge Plan:   Does  patient have access to transportation?: Yes Will patient be returning to same living situation after discharge?: Yes Currently receiving community mental  health services: No If no, would patient like referral for services when discharged?: Yes (What county[place] ) Does patient have financial barriers related to discharge medications?: Yes Patient description of barriers related to discharge medications: No insurance.   Summary/Recommendations:    Patient is a 40 year old female admitted with a diagnosis of Schizophrenia. Patient presented to the hospital with psychosis. Patient will benefit from crisis stabilization, medication evaluation, group therapy and psycho education in addition to case management for discharge. At discharge, it is recommended that patient remain compliant with established discharge plan and continued treatment.   Sarah Walton. MSW, [facility]  06/13/2015

## 2015-06-13 NOTE — [facility] Group Notes (Signed)
[facility] LCSW Group Therapy  06/13/2015 2:12 PM  Type of Therapy:  Group Therapy  Participation Level:  Minimal  Participation Quality:  Inattentive  Affect:  Drowsy   Cognitive:  Alert  Insight:  Limited  Engagement in Therapy:  Limited  Modes of Intervention:  Activity, Discussion, Education, Socialization and Support  Summary of Progress/Problems: Pt will identify unhealthy thoughts and how they impact their emotions and behavior. Pt will be encouraged to discuss these thoughts, emotions and behaviors with the group. Pt attended group and stayed most of the time. Pt sat quietly and listened to other group members share.   Yesika Rispoli L Bertin Inabinet MSW, LCSWA  06/13/2015, 2:12 PM

## 2015-06-14 MED ORDER — RISPERIDONE 3 MG PO TABS
3.0000 mg | ORAL_TABLET | Freq: Two times a day (BID) | ORAL | Status: DC
  Administered 2015-06-14 – 2015-06-15 (×2): 3 mg via ORAL
  Filled 2015-06-14 (×2): qty 1

## 2015-06-14 NOTE — Plan of Care (Signed)
Problem: Health Behavior/Discharge Planning: Goal: Compliance with prescribed medication regimen will improve Outcome: Progressing Patient has been compliant with medication

## 2015-06-14 NOTE — Plan of Care (Signed)
Problem: Activity: Goal: Will verbalize the importance of balancing activity with adequate rest periods Outcome: Progressing Encouraged patient to attend therapy groups and rest during free time.

## 2015-06-14 NOTE — Progress Notes (Addendum)
Patient with sad affect, cooperative behavior with meals, meds and plan of care. Guarded with minimal interaction with peers, verbalizes needs appropriately when staff initiates interaction. Patient encouraged to attend therapy groups. No SI/HI/AVH at this time. Patient guarded and needs encouragement for meals. Safety maintained. Reports her daily goal is to "work on her and her moms relationship". Patient noted to have  increasingly clearer thinking this morning. During afternoon time, patient noted with increasing preoccupation about her relationships with her Mother and Aunts, seems slightly confused.One on one with fair effect.  Sits in recliner in doorway of her room. Sleepy in late afternoon. Safety maintained.

## 2015-06-14 NOTE — [facility] Group Notes (Signed)
[facility] LCSW Group Therapy   06/14/2015 9:30am Type of Therapy: Group Therapy   Participation Level: Active   Participation Quality: Attentive, Sharing and Supportive   Affect: Depressed and Flat   Cognitive: Alert and Oriented   Insight: Developing/Improving and Engaged   Engagement in Therapy: Developing/Improving and Engaged   Modes of Intervention: Clarification, Confrontation, Discussion, Education, Exploration,  Limit-setting, Orientation, Problem-solving, Rapport Building, Dance movement psychotherapisteality Testing, Socialization and Support   Summary of Progress/Problems: Pt identified obstacles faced currently and processed barriers involved in overcoming these obstacles. Pt identified steps necessary for overcoming these obstacles and explored motivation (internal and external) for facing these difficulties head on. Pt further identified one area of concern in their lives and chose a goal to focus on for today. Pt shared that the pt's major obstacle when attempting to achieve the pt's goal of independence is an inability to achieve autonomy due to a history of not taking her mental health seriously.  Pt shared the pt's goal of seeking assistance from friends and family in the future in order to remain compliant with the pt's plan to discharge.  Pt reported the pt's main motivation is the pt's dream of living on her own.  Pt was polite and cooperative with the CSW and other group members and focused and attentive to the topics discussed and the sharing of others, although at times the pt presented as unwilling to share at length.   Dorothe PeaJonathan F. Parys Elenbaas, LCSWA, LCAS  06/14/15

## 2015-06-14 NOTE — Progress Notes (Signed)
[facility] MD Progress Note  06/14/2015 11:42 AM Sarah Walton  MRN:  [id number]  This is a follow up of Sarah Walton on 06/14/2015    Subjective:    patient seems better put together today.  She is wearing her sweatshirt on the proper side instead of inside out that she was the past several days.   She states that she is doing okay.  She  Feels relieved because "I connected the dots."  She went on to talk about a situation where her aunt introduced her whole family to a health food store. "  Think about it.  Everyone started using that helpful store and now look at it."  She believes this may be the cause of "everyone going crazy."  She blames her aunt for being admitted.  Denies thoughts of hurting herself, other people, hallucinations.  Still appears psychotic.    She states that she has been eating.   Per nursing,  D: Patient has stayed isolative to her room. She denies SI/HI. Endorses VH. States it's like she's training a dog. She would not talk specifically about why she's here. Denies pain. A: Medication was given with education. Encouragement was provided.  R: Patient was compliant with medication. She has remained calm and cooperative. Safety maintained with 15 min checks.   Principal Problem: Schizophrenia (HCC) Diagnosis:   Patient Active Problem List   Diagnosis Date Noted  . 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 Schizophrenia (HCC) [F20.9] 06/11/2015  . Psychosis [F29] 06/11/2015   Total Time spent with patient: 30 minutes  Past Psychiatric History: schizophrenia  Past Medical History:  Past Medical History  Diagnosis Date  . Anxiety   . History of migraine headaches    History reviewed. No pertinent past surgical history. Family History:  Family History  Problem Relation Age of Onset  . Anemia Mother    Family Psychiatric  History:  Schizophrenic and bipolar Social History:  History  Alcohol Use No     History  Drug Use No    Social  History   Social History  . Marital Status: Single    Spouse Name: N/A  . Number of Children: N/A  . Years of Education: N/A   Social History Main Topics  . Smoking status: Never Smoker   . Smokeless tobacco: None  . Alcohol Use: No  . Drug Use: No  . Sexual Activity: Not Asked   Other Topics Concern  . None   Social History Narrative   Additional Social History:                         Sleep: Fair  Appetite:  Fair  Current Medications: Current Facility-Administered Medications  Medication Dose Route Frequency Provider Last Rate Last Dose  . acetaminophen (TYLENOL) tablet 650 mg  650 mg Oral Q6H PRN Charm Rings, NP      . alum & mag hydroxide-simeth (MAALOX/MYLANTA) 200-200-20 MG/5ML suspension 30 mL  30 mL Oral Q4H PRN Jimmy Footman, MD      . diphenhydrAMINE (BENADRYL) capsule 25 mg  25 mg Oral Q6H PRN Lockie Pares, MD   25 mg at 06/12/15 2218   Or  . diphenhydrAMINE (BENADRYL) injection 25 mg  25 mg Intramuscular Q6H PRN Aldeen Riga L Akbar Sacra, MD      . haloperidol (HALDOL) tablet 5 mg  5 mg Oral Q8H PRN Lockie Pares, MD   5 mg at 06/12/15 1510  Or  . haloperidol lactate (HALDOL) injection 5 mg  5 mg Intramuscular Q6H PRN Becka Lagasse L Birdell Frasier, MD      . loratadine (CLARITIN) tablet 10 mg  10 mg Oral Daily Jolynne Spurgin Rhae Lerner, MD   10 mg at 06/14/15 0936  . magnesium hydroxide (MILK OF MAGNESIA) suspension 30 mL  30 mL Oral Daily PRN Jimmy Footman, MD      . risperiDONE (RISPERDAL) tablet 2 mg  2 mg Oral BID Lockie Pares, MD   2 mg at 06/14/15 0936  . sertraline (ZOLOFT) tablet 50 mg  50 mg Oral Daily Charm Rings, NP   50 mg at 06/14/15 0936  . traZODone (DESYREL) tablet 100 mg  100 mg Oral QHS PRN Jimmy Footman, MD   100 mg at 06/13/15 2229    Lab Results: No results found for this or any previous visit (from the past 48 hour(s)).  Blood Alcohol level:  Lab Results  Component Value Date   ETH <5 06/10/2015    Physical  Findings: AIMS:  , ,  ,  ,    CIWA:    COWS:     Musculoskeletal: Strength & Muscle Tone: within normal limits Gait & Station: normal Patient leans: N/A  Psychiatric Specialty Exam: Physical Exam  ROS  Blood pressure 121/71, pulse 93, temperature 98.5 F (36.9 C), temperature source Oral, resp. rate 18, height 5' (1.524 m), weight 73.936 kg (163 lb), SpO2 100 %.Body mass index is 31.83 kg/(m^2).  General Appearance: Bizarre and Disheveled -   Sweatshirt is on the correct side today.  Eye Contact:  Fair  Speech:  Blocked  Volume:  Normal  Mood:  Anxious and Irritable  Affect:  Inappropriate  Thought Process:  Disorganized and Irrelevant  Orientation:  Full (Time, Place, and Person)  Thought Content:  Illogical, Delusions, Paranoid Ideation and Rumination  Suicidal Thoughts:  No  Homicidal Thoughts:  No  Memory:  Immediate;   Fair Recent;   Fair Remote;   Fair  Judgement:  Impaired  Insight:  Lacking  Psychomotor Activity:  Decreased  Concentration:  Concentration: Poor  Recall:  Fair  Fund of Knowledge:  Poor  Language:  Fair  Akathisia:  No  Handed:    AIMS (if indicated):     Assets:  Desire for Improvement Housing Physical Health  ADL's:  Intact  Cognition:  Impaired,  Mild  Sleep:  Number of Hours: 8.15     Treatment Plan Summary: Daily contact with patient to assess and evaluate symptoms and progress in treatment and Medication management   40 year old disorganized with schizophrenia. Patient is poor historian due to disorganized thought process. She appears to respond to internal stimuli but  continues to denies this. She is paranoid about her mediations but agreed to take them today. Denies HI/SI/AVH.   Treatment Plan Summary: Daily contact with patient to assess and evaluate symptoms and progress in treatment and Medication management  Observation Level/Precautions: Elopement Continuous Observation  Laboratory: as ordered  Psychotherapy:  Milieu, groups  Medications: Risperdal 2 mg PO BID Haldol 5 mg PO/IM q6-8 ordered for agitation. If patient requires several prn doses, will adjust main medication to address needs Zoloft/Trazodone as ordered Benadryl for EPS  Consultations:   Discharge Concerns: Housing, social support  Estimated LOS: 5-7 days  Other:        3/28: Will increase Risperdal to address continue psychosis. Patient required prn of Haldol Saturday at 3 pm.   3/29:  Patient  has not required any Haldol when necessary for agitation since Saturday 5\27.   Risperdal will be increased to 3 mg twice a day.  Patient continues to be psychotic but looks less agitated and tense.  Denies any side effects from medication.  Benadryl available for EPS.  To monitor for side effects  continue Zoloft at 50 mg to address depressive symptoms  Johnnie Moten,  Jarion Hawthorne L, MD 06/14/2015, 11:42 AM

## 2015-06-14 NOTE — Progress Notes (Signed)
Recreation Therapy Notes  Date: 05.29.17 Time: 11:00 am Location: Craft Room  Group Topic: Self-expression  Goal Area(s) Addresses:  Patient will identify one color per emotion listed on wheel. Patient will verbalize benefit of using art as a means of self-expression. Patient will verbalize one emotion experienced during session. Patient will be educated on other forms of self-expression.  Behavioral Response: Attentive, Interactive  Intervention: Emotion Wheel  Activity: Patients were given an Emotion Wheel worksheet and instructed to pick a color for each emotion listed.   Education: LRT educated patients on other forms of self-expression.  Education Outcome: In group clarification offered  Clinical Observations/Feedback: Patient completed activity by picking a color for each emotion. Patient contributed to group discussion by stating what colors she picked for certain emotions and why.  Jacquelynn CreeGreene,Enos Muhl M, LRT/CTRS 06/14/2015 12:10 PM

## 2015-06-14 NOTE — Progress Notes (Signed)
D: Patient has stayed isolative to her room. She denies SI/HI. Endorses VH. States it's like she's training a dog. She would not talk specifically about why she's here. Denies pain. A: Medication was given with education. Encouragement was provided.  R: Patient was compliant with medication. She has remained calm and cooperative. Safety maintained with 15 min checks.

## 2015-06-15 MED ORDER — ARIPIPRAZOLE 15 MG PO TABS
15.0000 mg | ORAL_TABLET | Freq: Every day | ORAL | Status: DC
  Administered 2015-06-15: 15 mg via ORAL
  Filled 2015-06-15: qty 1

## 2015-06-15 MED ORDER — TRAZODONE HCL 100 MG PO TABS
100.0000 mg | ORAL_TABLET | Freq: Every evening | ORAL | Status: DC | PRN
  Filled 2015-06-15: qty 1

## 2015-06-15 MED ORDER — RISPERIDONE 3 MG PO TABS: 4.0000 mg | ORAL_TABLET | Freq: Every day | ORAL | Status: DC

## 2015-06-15 MED ORDER — RISPERIDONE 1 MG PO TABS: 2.0000 mg | ORAL_TABLET | Freq: Every day | ORAL | Status: DC

## 2015-06-15 NOTE — Progress Notes (Signed)
Pleasant and cooperative.  Denies SI/HI/AVH.  Denies depression.  Verbalizes that she needs to stay away from her family because when she is around them she ends up in places like this.  Support and encouragement offered.  Safety maintained.

## 2015-06-15 NOTE — [facility] Group Notes (Signed)
[facility] Group Notes:  (Nursing/MHT/Case Management/Adjunct)  Date:  06/15/2015  Time:  3:10 PM  Type of Therapy:  Psychoeducational Skills  Participation Level:  Minimal  Participation Quality:  Attentive  Affect:  Flat  Cognitive:  Oriented  Insight:  Improving  Engagement in Group:  Engaged  Modes of Intervention:  Discussion and Education  Summary of Progress/Problems:  Sarah Farberamela M Moses Ellison 06/15/2015, 3:10 PM

## 2015-06-15 NOTE — Progress Notes (Signed)
Recreation Therapy Notes  Date: 05.30.17 Time: 9:30 am Location: Craft Room  Group Topic: Goal Setting  Goal Area(s) Addresses:  Patient will be able to identify one goal. Patient will verbalize benefit of setting goals.  Behavioral Response: Did not attend   Intervention: Step By Step  Activity: Patients were given a foot worksheet and instructed to write a goal inside the foot and write positive comments on the outside of the foot.  Education: LRT educated patients on ways they can celebrate in a healthy way after reaching their goal.  Education Outcome: Patient did not attend group.  Clinical Observations/Feedback: Patient did not attend group.  Jacquelynn CreeGreene,Moxie Kalil M, LRT/CTRS 06/15/2015 10:15 AM

## 2015-06-15 NOTE — Progress Notes (Signed)
[facility]  06/15/2015 3:37 PM Sarah Walton  MRN:  [id number]  This is a follow up of Sarah HawthorneYolanda M Walton on 06/15/2015    Subjective:   Patient reports doing very well. She denies problems with mood, appetite, oversleeping and she feels that her energy is low and she would like to get some medications to help with her energy. She denies any suicidality, homicidality. She denies having any hallucinations at this time.  Per staff the patient has being display and hyper religiosity talking about having a spiritual warfare which to her means putting a course on people. Some other staff reported that she's been having loose associations and is very hard to follow her train of thought. Nurses report that she's been confused and very paranoid about medications  Per nursing,  D: Patient has stayed isolative to her room this evening. She denies SI/HI/AVH. States she had a good day because she talked to her mother. She got suspicious though when she started talking about her aunt. Stated her mom wants her to talk to her aunt but she doesn't want to. When asking her to elaborate and told me she couldn't. Denies pain. A: Medication was given with education. Encouragement was provided.  R: Patient was compliant with medication. She has remained calm and cooperative. Safety maintained with 15 min checks.   Principal Problem: Schizophrenia (HCC) Diagnosis:   Patient Active Problem List   Diagnosis Date Noted  . 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 Schizophrenia (HCC) [F20.9] 06/11/2015  . Psychosis [F29] 06/11/2015   Total Time spent with patient: 30 minutes  Past Psychiatric History: schizophrenia  Past Medical History:  Past Medical History  Diagnosis Date  . Anxiety   . History of migraine headaches    History reviewed. No pertinent past surgical history. Family History:  Family History  Problem Relation Age of Onset  . Anemia Mother    Family Psychiatric   History:  Schizophrenic and bipolar Social History:  History  Alcohol Use No     History  Drug Use No    Social History   Social History  . Marital Status: Single    Spouse Name: N/A  . Number of Children: N/A  . Years of Education: N/A   Social History Main Topics  . Smoking status: Never Smoker   . Smokeless tobacco: None  . Alcohol Use: No  . Drug Use: No  . Sexual Activity: Not Asked   Other Topics Concern  . None   Social History Narrative    Current Medications: Current Facility-Administered Medications  Medication Dose Route Frequency Provider Last Rate Last Dose  . acetaminophen (TYLENOL) tablet 650 mg  650 mg Oral Q6H PRN Charm RingsJamison Y Lord, NP      . alum & mag hydroxide-simeth (MAALOX/MYLANTA) 200-200-20 MG/5ML suspension 30 mL  30 mL Oral Q4H PRN Jimmy FootmanAndrea Hernandez-Gonzalez, MD      . ARIPiprazole (ABILIFY) tablet 15 mg  15 mg Oral Daily Jimmy FootmanAndrea Hernandez-Gonzalez, MD      . loratadine (CLARITIN) tablet 10 mg  10 mg Oral Daily Tiffani Rhae LernerL Bell, MD   10 mg at 06/15/15 0940  . magnesium hydroxide (MILK OF MAGNESIA) suspension 30 mL  30 mL Oral Daily PRN Jimmy FootmanAndrea Hernandez-Gonzalez, MD      . traZODone (DESYREL) tablet 100 mg  100 mg Oral QHS PRN Jimmy FootmanAndrea Hernandez-Gonzalez, MD        Lab Results: No results found for this or any previous visit (from the  past 48 hour(s)).  Blood Alcohol level:  Lab Results  Component Value Date   ETH <5 06/10/2015     Musculoskeletal: Strength & Muscle Tone: within normal limits Gait & Station: normal Patient leans: N/A  Psychiatric Specialty Exam: Physical Exam  Constitutional: She is oriented to person, place, and time. She appears well-developed and well-nourished.  HENT:  Head: Normocephalic and atraumatic.  Eyes: Conjunctivae and EOM are normal.  Neck: Normal range of motion.  Respiratory: Effort normal.  Musculoskeletal: Normal range of motion.  Neurological: She is alert and oriented to person, place, and time.     Review of Systems  Constitutional: Negative.   HENT: Negative.   Eyes: Negative.   Respiratory: Negative.   Cardiovascular: Negative.   Gastrointestinal: Negative.   Genitourinary: Negative.   Musculoskeletal: Negative.   Skin: Negative.   Neurological: Negative.   Endo/Heme/Allergies: Negative.   Psychiatric/Behavioral: Negative.     Blood pressure 123/66, pulse 97, temperature 98.6 F (37 C), temperature source Oral, resp. rate 18, height 5' (1.524 m), weight 73.936 kg (163 lb), SpO2 100 %.Body mass index is 31.83 kg/(m^2).  General Appearance: Bizarre and Disheveled -   Sweatshirt is on the correct side today.  Eye Contact:  Fair  Speech:  Blocked  Volume:  Normal  Mood:  Anxious and Irritable  Affect:  Inappropriate  Thought Process:  Disorganized and Irrelevant  Orientation:  Full (Time, Place, and Person)  Thought Content:  Illogical, Delusions, Paranoid Ideation and Rumination  Suicidal Thoughts:  No  Homicidal Thoughts:  No  Memory:  Immediate;   Fair Recent;   Fair Remote;   Fair  Judgement:  Impaired  Insight:  Lacking  Psychomotor Activity:  Decreased  Concentration:  Concentration: Poor  Recall:  Fair  Fund of Knowledge:  Poor  Language:  Fair  Akathisia:  No  Handed:    AIMS (if indicated):     Assets:  Desire for Improvement Housing Physical Health  ADL's:  Intact  Cognition:  Impaired,  Mild  Sleep:  Number of Hours: 8.5     Treatment Plan Summary: Daily contact with patient to assess and evaluate symptoms and progress in treatment and Medication management  This patient has a prior history of schizophrenia per chart. Looks that she was quite psychotic and agitated upon admission.  Today she tells me she feels a little sedated with the medication but denies any other side effects. She does recall having visual hallucinations prior to coming into the hospital but denies any other symptoms. This patient does not have a history of schizophrenia.  Patient tells me she was admitted at the [facility] a few years ago where she was treated for anxiety.  Schizophrenia patient complains of sedation with hospital. She appears quite cooperative and calm today. She still appears somewhat hyper religious. She has very limited insight and there is some level of paranoia.  Due to sedation I will start her on Abilify 15 mg a day. I plan to titrate up to 30 mg a day and I plan to offer her Abilify injectable upon discharge.  Poor allergies continue Claritin 10 mg a day  For insomnia I will order trazodone 100 mg by mouth by mouth daily at bedtime when necessary  Diet regular  Precautions every 15 minute checks  Disposition patient will return back to her apartment in [place]  Follow-up patient will need to have follow-up scheduled in [place].   Jimmy Footman, MD 06/15/2015, 3:37 PM

## 2015-06-15 NOTE — [facility] Group Notes (Signed)
[facility] Group Notes:  (Nursing/MHT/Case Management/Adjunct)  Date:  06/15/2015  Time:  9:26 PM  Type of Therapy:  Group Therapy  Participation Level:  Active  Participation Quality:  Appropriate  Affect:  Appropriate  Cognitive:  Appropriate  Insight:  Appropriate  Engagement in Group:  Engaged  Modes of Intervention:  Discussion  Summary of Progress/Problems:  Sarah Walton 06/15/2015, 9:26 PM

## 2015-06-15 NOTE — Plan of Care (Signed)
Problem: Coping: Goal: Ability to verbalize feelings will improve Outcome: Progressing Stated she was happy after her mother visiting

## 2015-06-15 NOTE — Progress Notes (Signed)
D: Patient has stayed isolative to her room this evening. She denies SI/HI/AVH. States she had a good day because she talked to her mother. She got suspicious though when she started talking about her aunt. Stated her mom wants her to talk to her aunt but she doesn't want to. When asking her to elaborate and told me she couldn't. Denies pain. A: Medication was given with education. Encouragement was provided.  R: Patient was compliant with medication. She has remained calm and cooperative. Safety maintained with 15 min checks.

## 2015-06-15 NOTE — Tx Team (Signed)
Interdisciplinary Treatment Plan Update (Adult)  Date:  06/15/2015 Time Reviewed:  4:53 PM  Progress in Treatment: Attending groups: Yes. Participating in groups:  Yes. Taking medication as prescribed:  Yes. Tolerating medication:  Yes. Family/Significant othe contact made:  No, will contact:    Patient understands diagnosis:  No. Discussing patient identified problems/goals with staff:  No. Medical problems stabilized or resolved:  Yes. Denies suicidal/homicidal ideation: Yes. Issues/concerns per patient self-inventory:  No. Other:  New problem(s) identified: No, Describe:     Discharge Plan or Barriers: TBD  Reason for Continuation of Hospitalization: Delusions  Hallucinations Medication stabilization  Comments:40 year old with schizophrenia admitted after being found wandering on [address]. Patient is disorganized and difficult to follow today. She states that she is here because "I was tired. Eerything from "lotteries to everything being one sided, to me coughing like crazy. Being sick and throwing up my hands."  Estimated length of stay:7 days  New goal(s):  Review of initial/current patient goals per problem list:   1.  Goal(s):Pt will participate in aftercare plan.  Met:  No  Target date:At Discharge  As evidenced FB:XUXY for housing and psychiatric follow up being identified  2.  Goal (s):.Pt will demonstrate decrease in psychotic symptoms.  Met:  No  Target date:At discharge  As evidenced BF:XOVANVBT in signs and symptoms of psychosis OR Pt is deemed stable or at baseline by MD.  Attendees: Patient:  Sarah Walton 5/30/20174:53 PM  Family:   5/30/20174:53 PM  Physician:  Hernandez 5/30/20174:53 PM  Nursing:   Elige Radon, RN 5/30/20174:53 PM  Case Manager:   5/30/20174:53 PM  Counselor:  Dossie Arbour, LCSW 5/30/20174:53 PM  Other:  Everitt Amber, LRT 5/30/20174:53 PM  Other:  Bonnye Fava, LCSWA 5/30/20174:53 PM  Other:  Marylou Flesher, LCSWA  5/30/20174:53 PM  Other:  5/30/20174:53 PM  Other:  5/30/20174:53 PM  Other:  5/30/20174:53 PM  Other:  5/30/20174:53 PM  Other:  5/30/20174:53 PM  Other:  5/30/20174:53 PM  Other:   5/30/20174:53 PM   Scribe for Treatment Team:   August Saucer, 06/15/2015, 4:53 PM, MSW, LCSW

## 2015-06-15 NOTE — Plan of Care (Signed)
Problem: Coping: Goal: Ability to verbalize frustrations and anger appropriately will improve Outcome: Not Progressing Unwilling to discuss what brought her here.

## 2015-06-15 NOTE — Progress Notes (Signed)
Recreation Therapy Notes  Date: 05.30.17 Time: 2:30 pm Location: Craft Room  Group Topic: Goal Setting  Goal Area(s) Addresses:  Patient will identify at least one goal. Patient will identify at least one obstacle.  Behavioral Response: Attentive  Intervention: Recovery Goal Chart  Activity: Patients were instructed to make a Recovery Goal Chart including goals, obstacles, the date they started working on their goals, and the date they achieved their goals.  Education: LRT educated patients on ways they can celebrate in a healthy way after reaching their goal.  Education Outcome: In group clarification offered  Clinical Observations/Feedback: Patient completed activity by writing goals and obstacles. Patient did not contribute to group discussion.  Jacquelynn CreeGreene,Corwin Kuiken M, LRT/CTRS 06/15/2015 3:30 PM

## 2015-06-15 NOTE — [facility] Group Notes (Signed)
[facility] Group Notes:  (Nursing/MHT/Case Management/Adjunct)  Date:  06/15/2015  Time:  12:46 AM  Type of Therapy:  Psychoeducational Skills  Participation Level:  Did Not Attend  Summary of Progress/Problems:  Sarah MilroyLaquanda Walton [name] 06/15/2015, 12:46 AM

## 2015-06-16 LAB — LIPID PANEL
CHOLESTEROL: 127 mg/dL (ref 0–200)
HDL: 34 mg/dL — AB (ref >40)
LDL Cholesterol: 83 mg/dL (ref 0–99)
TRIGLYCERIDES: 50 mg/dL (ref <150)
Total CHOL/HDL Ratio: 3.7 RATIO
VLDL: 10 mg/dL (ref 0–40)

## 2015-06-16 LAB — HEMOGLOBIN A1C: Hgb A1c MFr Bld: 5.9 % (ref 4.0–6.0)

## 2015-06-16 LAB — TSH: TSH: 1.674 u[IU]/mL (ref 0.350–4.500)

## 2015-06-16 LAB — VITAMIN B12: Vitamin B-12: 1469 pg/mL — ABNORMAL HIGH (ref 180–914)

## 2015-06-16 MED ORDER — ARIPIPRAZOLE 15 MG PO TABS
30.0000 mg | ORAL_TABLET | Freq: Every day | ORAL | Status: DC
  Administered 2015-06-16 – 2015-06-18 (×3): 30 mg via ORAL
  Filled 2015-06-16 (×3): qty 2

## 2015-06-16 NOTE — Progress Notes (Addendum)
[facility]  06/16/2015 8:25 AM Sarah Walton  MRN:  [id number]  This is a follow up of Sarah Walton on 06/16/2015    Subjective:   Patient reports doing very well. She denies problems with mood, appetite, sleep, energy, or concentration. She denies any suicidality, homicidality. She denies having any hallucinations at this time. Patient reports tolerating medications well. She denies any side effects. She denies having any physical complaints.  Nursing staff reported the patient has very limited insight into her situation. She continues to as stated that she does not have a schizophrenia and she does not understand why she was admitted to the hospital.  Staff report patient appears to be in proving however she continues to be vague at times and guarded  Per staff on 5/30  the patient has being display and hyper religiosity talking about having a spiritual warfare which to her means putting a course on people. Some other staff reported that she's been having loose associations and is very hard to follow her train of thought. Nurses report that she's been confused and very paranoid about medications  Per nursing,  D: Patient has stayed isolative to her room this evening. She denies SI/HI/AVH. Stated she talked to the doctor and was happy with the medication change. Denies pain. A: No scheduled medication this evening. Encouragement was provided.  R: She has been compliant with unit rules. She has remained calm and cooperative. Safety maintained with 15 min checks   Principal Problem: Schizophrenia (HCC) Diagnosis:   Patient Active Problem List   Diagnosis Date Noted  . 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 Schizophrenia (HCC) [F20.9] 06/11/2015  . Psychosis [F29] 06/11/2015   Total Time spent with patient: 30 minutes  Past Psychiatric History: schizophrenia  Past Medical History:  Past Medical History  Diagnosis Date  . Anxiety   . History of  migraine headaches    History reviewed. No pertinent past surgical history. Family History:  Family History  Problem Relation Age of Onset  . Anemia Mother    Family Psychiatric  History:  Schizophrenic and bipolar Social History:  History  Alcohol Use No     History  Drug Use No    Social History   Social History  . Marital Status: Single    Spouse Name: N/A  . Number of Children: N/A  . Years of Education: N/A   Social History Main Topics  . Smoking status: Never Smoker   . Smokeless tobacco: None  . Alcohol Use: No  . Drug Use: No  . Sexual Activity: Not Asked   Other Topics Concern  . None   Social History Narrative    Current Medications: Current Facility-Administered Medications  Medication Dose Route Frequency Provider Last Rate Last Dose  . acetaminophen (TYLENOL) tablet 650 mg  650 mg Oral Q6H PRN Charm RingsJamison Y Lord, NP      . alum & mag hydroxide-simeth (MAALOX/MYLANTA) 200-200-20 MG/5ML suspension 30 mL  30 mL Oral Q4H PRN Jimmy FootmanAndrea Hernandez-Gonzalez, MD      . ARIPiprazole (ABILIFY) tablet 30 mg  30 mg Oral Daily Jimmy FootmanAndrea Hernandez-Gonzalez, MD      . loratadine (CLARITIN) tablet 10 mg  10 mg Oral Daily Tiffani Rhae LernerL Bell, MD   10 mg at 06/15/15 0940  . magnesium hydroxide (MILK OF MAGNESIA) suspension 30 mL  30 mL Oral Daily PRN Jimmy FootmanAndrea Hernandez-Gonzalez, MD      . traZODone (DESYREL) tablet 100 mg  100 mg Oral QHS  PRN Jimmy Footman, MD        Lab Results: No results found for this or any previous visit (from the past 48 hour(s)).  Blood Alcohol level:  Lab Results  Component Value Date   ETH <5 06/10/2015     Musculoskeletal: Strength & Muscle Tone: within normal limits Gait & Station: normal Patient leans: N/A  Psychiatric Specialty Exam: Physical Exam  Constitutional: She is oriented to person, place, and time. She appears well-developed and well-nourished.  HENT:  Head: Normocephalic and atraumatic.  Eyes: Conjunctivae and EOM are  normal.  Neck: Normal range of motion.  Respiratory: Effort normal.  Musculoskeletal: Normal range of motion.  Neurological: She is alert and oriented to person, place, and time.    Review of Systems  Constitutional: Negative.   HENT: Negative.   Eyes: Negative.   Respiratory: Negative.   Cardiovascular: Negative.   Gastrointestinal: Negative.   Genitourinary: Negative.   Musculoskeletal: Negative.   Skin: Negative.   Neurological: Negative.   Endo/Heme/Allergies: Negative.   Psychiatric/Behavioral: Negative.     Blood pressure 114/77, pulse 92, temperature 97 F (36.1 C), temperature source Oral, resp. rate 18, height 5' (1.524 m), weight 73.936 kg (163 lb), SpO2 100 %.Body mass index is 31.83 kg/(m^2).  General Appearance: Fairly Groomed -     Eye Contact:  Fair  Speech:  Normal Rate  Volume:  Normal  Mood:  Euthymic  Affect:  Appropriate  Thought Process:  Linear and Descriptions of Associations: Intact  Orientation:  Full (Time, Place, and Person)  Thought Content:  Paranoid Ideation  Suicidal Thoughts:  No  Homicidal Thoughts:  No  Memory:  Immediate;   Fair Recent;   Fair Remote;   Fair  Judgement:  Impaired  Insight:  Lacking  Psychomotor Activity:  Normal  Concentration:  Concentration: Fair and Attention Span: Fair  Recall:  [employer] of Knowledge:  Fair  Language:  Fair  Akathisia:  No  Handed:    AIMS (if indicated):     Assets:  Desire for Improvement Housing Physical Health  ADL's:  Intact  Cognition:  WNL  Sleep:  Number of Hours: 8     Treatment Plan Summary: Daily contact with patient to assess and evaluate symptoms and progress in treatment and Medication management  This patient has a prior history of schizophrenia per chart. Looks that she was quite psychotic and agitated upon admission.  Today she tells me she feels a little sedated with the medication but denies any other side effects. She does recall having visual hallucinations prior  to coming into the hospital but denies any other symptoms. This patient does not have a history of schizophrenia. Patient tells me she was admitted at the [facility] a few years ago where she was treated for anxiety.  Schizophrenia patient complains of sedation with hospital. She appears quite cooperative and calm today. She still appears somewhat hyper religious. She has very limited insight and there is some level of paranoia.  Due to sedation I staredt her on Abilify.  Today I will increase dose to 30 mg po q day. I plan to offer her Abilify injectable upon discharge.  Poor allergies continue Claritin 10 mg a day  For insomnia: I will order trazodone 100 mg by mouth by mouth daily at bedtime when necessary. Pt slept 8 h last night  Diet regular  Precautions every 15 minute checks  Disposition patient will return back to her apartment in [place]  Follow-up patient  will need to have follow-up scheduled in [place].  Labs vitamin B12 and TSH are within the normal limits.   Jimmy Footman, MD 06/16/2015, 8:25 AM

## 2015-06-16 NOTE — Progress Notes (Signed)
D: Patient has stayed isolative to her room this evening. She denies SI/HI/AVH. Stated she talked to the doctor and was happy with the medication change. Denies pain. A: No scheduled medication this evening. Encouragement was provided.  R: She has been compliant with unit rules. She has remained calm and cooperative. Safety maintained with 15 min checks

## 2015-06-16 NOTE — [facility] Group Notes (Signed)
[facility] LCSW Group Therapy   06/16/2015  9:30am   Type of Therapy: Group Therapy   Participation Level: Active   Participation Quality: Attentive, Sharing and Supportive   Affect: Depressed and Flat   Cognitive: Alert and Oriented   Insight: Developing/Improving and Engaged   Engagement in Therapy: Developing/Improving and Engaged   Modes of Intervention: Clarification, Confrontation, Discussion, Education, Exploration, Limit-setting, Orientation, Problem-solving, Rapport Building, Dance movement psychotherapisteality Testing, Socialization and Support   Summary of Progress/Problems: The topic for group today was emotional regulation. This group focused on both positive and negative emotion identification and allowed  group members to process ways to identify feelings, regulate negative emotions, and find healthy ways to manage internal/external emotions. Group members were asked to reflect on a time when their reaction to an emotion led to a negative outcome and explored how alternative responses using emotion regulation would have benefited them. Group members were also asked to discuss a time when emotion regulation was utilized when a negative emotion was experienced. Pt shared at length little initially, but at times presented as manic.  Affter prompting from the CSW to keep her sharing short and on the group topic, the pt responded in a polite manner and presented as pleasant and calm and was an addition to the group dynamic.  Pt shared she has used reading and talking about what bothers her in order to regulate her emotions.  Pt shared that she once withdrew from others and immediately began seeing how she blamed and that this was a pattern for the pt.  Pt was polite and cooperative with the CSW and other group members and focused and attentive to the topics discussed and the sharing of others. Pt shared that talking to others in a constructive manner was her most important tool for recovery.   Dorothe PeaJonathan F. Jory Tanguma,  LCSWA, LCAS  06/16/15    Dorothe PeaJonathan F. Detra Bores, MSW, LCSWA, LCAS

## 2015-06-16 NOTE — [facility] Group Notes (Signed)
[facility] Group Notes:  (Nursing/MHT/Case Management/Adjunct)  Date:  06/16/2015  Time:  4:34 PM  Type of Therapy:  Psychoeducational Skills  Participation Level:  Active  Participation Quality:  Appropriate, Attentive and Sharing  Affect:  Appropriate  Cognitive:  Alert and Appropriate  Insight:  Appropriate  Engagement in Group:  Engaged  Modes of Intervention:  Discussion, Education and Support  Summary of Progress/Problems:  Sarah Walton [facility] HospitalMadoni 06/16/2015, 4:34 PM

## 2015-06-16 NOTE — Progress Notes (Signed)
Recreation Therapy Notes  Date: 05.31.17 Time: 9:30 am Location: Craft Room  Group Topic: Self-esteem  Goal Area(s) Addresses:  Patient will identify positive traits about self. Patient will identify healthy coping skills.  Behavioral Response: Attentive  Intervention: All About Me  Activity: Patients were instructed to make a pamphlet including positive traits, healthy coping skills, and their support system.  Education: LRT educated patients on ways to increase their self-esteem.  Education Outcome: Acknowledges education/In group clarification offered  Clinical Observations/Feedback: Patient completed activity by writing positive traits, healthy coping skills, and her support system. Patient did not contribute to group discussion.  Jacquelynn CreeGreene,Jarryd Gratz M, LRT/CTRS 06/16/2015 10:53 AM

## 2015-06-16 NOTE — Progress Notes (Signed)
Presents with a bright affect.  Denies SI/HI/AVH.  Denies depression.  Verbalized goal "stay focused."  Medication and group compliant.  Asked "When I am discharged, can the police take me home, I don't want to have to deal with my family"  Informed that I would inform the social worker of her request. Candace informed that patient police to take her home.  Medication and group compliant.  More visible in the milieu.  Performed ADL's today.  Good appetite.  Support and encouragement offered.  Safety maintained.

## 2015-06-16 NOTE — Plan of Care (Signed)
Problem: Coping: Goal: Ability to identify and develop effective coping behavior will improve Outcome: Progressing Patient on this shift identifies some coping mechanisms to use when in crisis such as redirection of thinking [place] RN

## 2015-06-16 NOTE — Progress Notes (Signed)
D: Patient is alert and oriented on the unit this shift. Patient does not attend and   Participate in groups today. Patient denies suicidal ideation, homicidal ideation,has  auditory  but no  visual hallucinations at the present time.  A: Scheduled medications are administered to patient as per MD orders. Emotional support and encouragement are provided. Patient is maintained on q.15 minute safety checks. Patient is informed to notify staff with questions or concerns. R: No adverse medication reactions are noted. Patient is cooperative with medication administration and treatment plan today. Patient is receptive,  anxious and cooperative on the unit at this time. Patient does not  Interact  with others on the unit this shift. Patient contracts for safety at this time. Patient remains safe at this time.

## 2015-06-16 NOTE — Progress Notes (Signed)
Pleasant and cooperative.  Affect bright.  Denies SI/HI/AVH.  Medication and group compliant.  Support and encouragement offered.  Safety maintained.

## 2015-06-17 LAB — PROLACTIN: PROLACTIN: 62.9 ng/mL — AB (ref 4.8–23.3)

## 2015-06-17 MED ORDER — ARIPIPRAZOLE 30 MG PO TABS: 30.0000 mg | ORAL_TABLET | Freq: Every day | ORAL | Status: DC

## 2015-06-17 MED ORDER — ARIPIPRAZOLE ER 400 MG IM SUSR
400.0000 mg | INTRAMUSCULAR | Status: DC
  Administered 2015-06-17: 400 mg via INTRAMUSCULAR
  Filled 2015-06-17: qty 400

## 2015-06-17 MED ORDER — ARIPIPRAZOLE ER 400 MG IM SUSR: 400.0000 mg | INTRAMUSCULAR | Status: DC

## 2015-06-17 NOTE — Progress Notes (Signed)
[facility]  06/17/2015 9:55 AM Burnard HawthorneYolanda M Koelling  MRN:  [id number]  This is a follow up of Burnard HawthorneYolanda M Kohrs on 06/17/2015    Subjective:   Patient reports doing very well. She denies problems with mood, appetite, sleep, energy, or concentration. She denies any suicidality, homicidality. She denies having any hallucinations at this time. Patient reports tolerating medications well. She denies any side effects. She denies having any physical complaints.  Patient is still displaying very limited insight. She is still somewhat paranoid towards her family was in agreement with as contacting her aunt. During conversation her statements are very vague.   Per staff on 5/30  the patient has being display and hyper religiosity talking about having a spiritual warfare which to her means putting a course on people. Some other staff reported that she's been having loose associations and is very hard to follow her train of thought. Nurses report that she's been confused and very paranoid about medications  Per nursing,  D: Patient is alert and oriented on the unit this shift. Patient does not attend and Participate in groups today. Patient denies suicidal ideation, homicidal ideation,has auditory but no visual hallucinations at the present time.  A: Scheduled medications are administered to patient as per MD orders. Emotional support and encouragement are provided. Patient is maintained on q.15 minute safety checks. Patient is informed to notify staff with questions or concerns. R: No adverse medication reactions are noted. Patient is cooperative with medication administration and treatment plan today. Patient is receptive, anxious and cooperative on the unit at this time. Patient does not Interact with others on the unit this shift. Patient contracts for safety at this time. Patient remains safe at this time.  Principal Problem: Schizophrenia (HCC) Diagnosis:   Patient Active Problem List   Diagnosis Date Noted  . 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 Schizophrenia (HCC) [F20.9] 06/11/2015  . Psychosis [F29] 06/11/2015   Total Time spent with patient: 30 minutes  Past Psychiatric History: schizophrenia  Past Medical History:  Past Medical History  Diagnosis Date  . Anxiety   . History of migraine headaches    History reviewed. No pertinent past surgical history. Family History:  Family History  Problem Relation Age of Onset  . Anemia Mother    Family Psychiatric  History:  Schizophrenic and bipolar Social History:  History  Alcohol Use No     History  Drug Use No    Social History   Social History  . Marital Status: Single    Spouse Name: N/A  . Number of Children: N/A  . Years of Education: N/A   Social History Main Topics  . Smoking status: Never Smoker   . Smokeless tobacco: None  . Alcohol Use: No  . Drug Use: No  . Sexual Activity: Not Asked   Other Topics Concern  . None   Social History Narrative    Current Medications: Current Facility-Administered Medications  Medication Dose Route Frequency Provider Last Rate Last Dose  . acetaminophen (TYLENOL) tablet 650 mg  650 mg Oral Q6H PRN Charm RingsJamison Y Lord, NP   650 mg at 06/16/15 1421  . alum & mag hydroxide-simeth (MAALOX/MYLANTA) 200-200-20 MG/5ML suspension 30 mL  30 mL Oral Q4H PRN Jimmy FootmanAndrea Hernandez-Gonzalez, MD      . ARIPiprazole (ABILIFY) tablet 30 mg  30 mg Oral Daily Jimmy FootmanAndrea Hernandez-Gonzalez, MD   30 mg at 06/17/15 0909  . ARIPiprazole SUSR 400 mg  400 mg Intramuscular Q30 days  Jimmy Footman, MD      . loratadine (CLARITIN) tablet 10 mg  10 mg Oral Daily Lockie Pares, MD   10 mg at 06/17/15 0909  . magnesium hydroxide (MILK OF MAGNESIA) suspension 30 mL  30 mL Oral Daily PRN Jimmy Footman, MD      . traZODone (DESYREL) tablet 100 mg  100 mg Oral QHS PRN Jimmy Footman, MD        Lab Results:  Results for orders placed or  performed during the hospital encounter of 06/11/15 (from the past 48 hour(s))  TSH     Status: None   Collection Time: 06/16/15  7:12 AM  Result Value Ref Range   TSH 1.674 0.350 - 4.500 uIU/mL  Prolactin     Status: Abnormal   Collection Time: 06/16/15  7:12 AM  Result Value Ref Range   Prolactin 62.9 (H) 4.8 - 23.3 ng/mL    Comment: (NOTE) Performed At: [facility] [address] [place], [place] [id number] Mila Homer MD NW:[id number]   Lipid panel     Status: Abnormal   Collection Time: 06/16/15  7:12 AM  Result Value Ref Range   Cholesterol 127 0 - 200 mg/dL   Triglycerides 50 <578 mg/dL   HDL 34 (L) >46 mg/dL   Total CHOL/HDL Ratio 3.7 RATIO   VLDL 10 0 - 40 mg/dL   LDL Cholesterol 83 0 - 99 mg/dL    Comment:        Total Cholesterol/HDL:CHD Risk Coronary Heart Disease Risk Table                     Men   Women  1/2 Average Risk   3.4   3.3  Average Risk       5.0   4.4  2 X Average Risk   9.6   7.1  3 X Average Risk  23.4   11.0        Use the calculated Patient Ratio above and the CHD Risk Table to determine the patient's CHD Risk.        ATP III CLASSIFICATION (LDL):  <100     mg/dL   Optimal  962-952  mg/dL   Near or Above                    Optimal  130-159  mg/dL   Borderline  841-324  mg/dL   High  >401     mg/dL   Very High   Vitamin U27     Status: Abnormal   Collection Time: 06/16/15  7:12 AM  Result Value Ref Range   Vitamin B-12 1469 (H) 180 - 914 pg/mL    Comment: (NOTE) This assay is not validated for testing neonatal or myeloproliferative syndrome specimens for Vitamin B12 levels. Performed at [facility]   Hemoglobin A1c     Status: None   Collection Time: 06/16/15  7:12 AM  Result Value Ref Range   Hgb A1c MFr Bld 5.9 4.0 - 6.0 %    Blood Alcohol level:  Lab Results  Component Value Date   ETH <5 06/10/2015     Musculoskeletal: Strength & Muscle Tone: within normal limits Gait & Station: normal Patient  leans: N/A  Psychiatric Specialty Exam: Physical Exam  Constitutional: She is oriented to person, place, and time. She appears well-developed and well-nourished.  HENT:  Head: Normocephalic and atraumatic.  Eyes: Conjunctivae and EOM are normal.  Neck: Normal  range of motion.  Respiratory: Effort normal.  Musculoskeletal: Normal range of motion.  Neurological: She is alert and oriented to person, place, and time.    Review of Systems  Constitutional: Negative.   HENT: Negative.   Eyes: Negative.   Respiratory: Negative.   Cardiovascular: Negative.   Gastrointestinal: Negative.   Genitourinary: Negative.   Musculoskeletal: Negative.   Skin: Negative.   Neurological: Negative.   Endo/Heme/Allergies: Negative.   Psychiatric/Behavioral: Negative.     Blood pressure 113/71, pulse 76, temperature 98.7 F (37.1 C), temperature source Oral, resp. rate 18, height 5' (1.524 m), weight 73.936 kg (163 lb), SpO2 100 %.Body mass index is 31.83 kg/(m^2).  General Appearance: Fairly Groomed -     Eye Contact:  Fair  Speech:  Normal Rate  Volume:  Normal  Mood:  Euthymic  Affect:  Appropriate  Thought Process:  Linear and Descriptions of Associations: Intact  Orientation:  Full (Time, Place, and Person)  Thought Content:  Paranoid Ideation  Suicidal Thoughts:  No  Homicidal Thoughts:  No  Memory:  Immediate;   Fair Recent;   Fair Remote;   Fair  Judgement:  Impaired  Insight:  Lacking  Psychomotor Activity:  Normal  Concentration:  Concentration: Fair and Attention Span: Fair  Recall:  [employer] of Knowledge:  Fair  Language:  Fair  Akathisia:  No  Handed:    AIMS (if indicated):     Assets:  Desire for Improvement Housing Physical Health  ADL's:  Intact  Cognition:  WNL  Sleep:  Number of Hours: 7     Treatment Plan Summary: Daily contact with patient to assess and evaluate symptoms and progress in treatment and Medication management  This patient has a prior history  of schizophrenia per chart. Looks that she was quite psychotic and agitated upon admission.  Today she tells me she feels a little sedated with the medication but denies any other side effects. She does recall having visual hallucinations prior to coming into the hospital but denies any other symptoms. This patient does not have a history of schizophrenia. Patient tells me she was admitted at the [facility] a few years ago where she was treated for anxiety.  Schizophrenia patient complains of sedation with hospital. She appears quite cooperative and calm today. She still appears somewhat hyper religious. She has very limited insight and there is some level of paranoia.  Due to sedation I started her on Abilify.    Patient has been doing well on Abilify 30 mg by mouth daily. She denies any side effects. I will order Abilify injectable 400 mg today .  Poor allergies continue Claritin 10 mg a day  For insomnia: I will order trazodone 100 mg by mouth by mouth daily at bedtime when necessary. Pt slept 8 h last night  Diet regular  Precautions every 15 minute checks  Disposition patient will return back to her apartment in [place]  Follow-up patient will need to have follow-up scheduled in [place].  Labs vitamin B12 and TSH are within the normal limits.  Plan to discharge tomorrow.  Social worker to contact family   Jimmy Footman, MD 06/17/2015, 9:55 AM

## 2015-06-17 NOTE — [facility] Group Notes (Signed)
[facility] Group Notes:  (Nursing/MHT/Case Management/Adjunct)  Date:  06/17/2015  Time:  1:58 AM  Type of Therapy:  Group Therapy  Participation Level:  Active  Participation Quality:  Appropriate and Supportive  Affect:  Appropriate  Cognitive:  Appropriate  Insight:  Good and Improving  Engagement in Group:  Developing/Improving and Engaged  Modes of Intervention:  n/a  Summary of Progress/Problems:  Sarah Walton 06/17/2015, 1:58 AM

## 2015-06-17 NOTE — Progress Notes (Signed)
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 She's been observed interacting ably with peers. She's med compliant. She denies SI/HI//AVH. She has attended group this a.m. A: Meds given as ordered. Q15 safety checks maintained. Support/encouragement offered.  R: Pt remains free from harm and continues with treatment. Will continue to monitor for needs/safety.

## 2015-06-17 NOTE — Progress Notes (Signed)
Recreation Therapy Notes  Date: 06.01.17 Time: 9:30 am Location: Craft Room  Group Topic: Leisure Education  Goal Area(s) Addresses:  Patient will identify activities for each letter of the alphabet. Patient will verbalize emotion experienced from leisure activities.  Behavioral Response: Attentive, Interactive  Intervention: Leisure Alphabet  Activity: Patients were given a Leisure Alphabet and instructed to write a healthy leisure activity for each letter of the alphabet.  Education: LRT educated patients on what they need to participate in leisure.  Education Outcome: Acknowledges education/In group clarification offered   Clinical Observations/Feedback: Patient completed activity by writing healthy leisure activities. Patient contributed to group discussion by stating some healthy leisure activities, what she needs to participate in leisure, how she can put leisure back in her schedule, and what emotions she feels when she participates in leisure.  Jacquelynn CreeGreene,Uzziah Rigg M, LRT/CTRS 06/17/2015 10:24 AM

## 2015-06-18 MED ORDER — ARIPIPRAZOLE ER 400 MG IM SUSR: 400.0000 mg | INTRAMUSCULAR | Status: DC

## 2015-06-18 NOTE — Progress Notes (Signed)
D: Pt denies SI/HI/AVH. Pt is pleasant and cooperative, affect is flat but brightens upon approach, no bizarre episode noted. Patient's thoughts are organized, she appears less anxious and she is interacting with peers and staff appropriately.  A: Pt was offered support and encouragement.  Pt was encouraged to attend groups. Q 15 minute checks were done for safety.  R:Pt did not attend group. Pt has no complaints.Pt receptive to treatment and safety maintained on unit.

## 2015-06-18 NOTE — Tx Team (Signed)
Interdisciplinary Treatment Plan Update (Adult)  Date:  06/18/2015 Time Reviewed:  9:56 AM  Progress in Treatment: Attending groups: Yes. Participating in groups:  Yes. Taking medication as prescribed:  Yes. Tolerating medication:  Yes. Family/Significant othe contact made:  Yes, individual(s) contacted:  mother  Patient understands diagnosis:  No. and As evidenced by:  limited insight ] Discussing patient identified problems/goals with staff:  Yes. Medical problems stabilized or resolved:  Yes. Denies suicidal/homicidal ideation: Yes. Issues/concerns per patient self-inventory:  Yes. Other:  New problem(s) identified: Yes, Describe:  NA  Discharge Plan or Barriers: Pt plans to return home and follow up with outpatient.    Reason for Continuation of Hospitalization: Delusions  Hallucinations Medication stabilization  Comments:Patient reports doing very well. She denies problems with mood, appetite, sleep, energy, or concentration. She denies any suicidality, homicidality. She denies having any hallucinations at this time. Patient reports tolerating medications well. She denies any side effects. She denies having any physical complaints. Nursing staff reported the patient has very limited insight into her situation. She continues to as stated that she does not have a schizophrenia and she does not understand why she was admitted to the hospital.  Estimated length of stay: Pt will likely d/c today.    New goal(s): NA  Review of initial/current patient goals per problem list:   1.  Goal(s): Patient will participate in aftercare plan * Met: Yes * Target date: at discharge * As evidenced by: Patient will participate within aftercare plan AEB aftercare provider and housing plan at discharge being identified.  2.  Goal (s): Patient will demonstrate decreased symptoms of psychosis. * Met: Yes *  Target date: at discharge * As evidenced by: Patient will not endorse signs of psychosis  or be deemed stable for discharge by MD.   Attendees: Patient:  Sarah Walton 6/2/20179:56 AM  Family:   6/2/20179:56 AM  Physician:  Dr. Jerilee Hoh   6/2/20179:56 AM  Nursing:   Seth Bake, RN  6/2/20179:56 AM  Case Manager:   6/2/20179:56 AM  Counselor:   6/2/20179:56 AM  Other:  Wray Kearns, [place] 6/2/20179:56 AM  Other:  Everitt Amber, [place]  6/2/20179:56 AM  Other:   6/2/20179:56 AM  Other:  6/2/20179:56 AM  Other:  6/2/20179:56 AM  Other:  6/2/20179:56 AM  Other:  6/2/20179:56 AM  Other:  6/2/20179:56 AM  Other:  6/2/20179:56 AM  Other:   6/2/20179:56 AM   Scribe for Treatment Team:   Wray Kearns, MSW, [place]  06/18/2015, 9:56 AM

## 2015-06-18 NOTE — Progress Notes (Signed)
  [facility] :  Will you be returning to the same living situation after discharge:  Yes,  Home  At discharge, do you have transportation home?: Yes,  [place] Do you have the ability to pay for your medications: Yes,  insurance, income.   Release of information consent forms completed and in the chart;  Patient's signature needed at discharge.  Patient to Follow up at: Follow-up Information    [facility].   Specialty:  Behavioral Health   Why:  Your hospital follow up appointment will be walk in. Walk in hours are Monday- Friday between 8:00am and 10:00am. Please take your ID and insurance information.    Contact information:   [address] [place] [address] [phone]       Next level of care provider has [facility] Link:no  Safety Planning and Suicide Prevention discussed: Yes,  with patient and mother   Have you used any form of tobacco in the last 30 days? (Cigarettes, Smokeless Tobacco, Cigars, and/or Pipes): No  Has patient been referred to the Quitline?: N/A patient is not a smoker  Patient has been referred for addiction treatment: N/A  Rondall Allegraandace L Creighton Longley MSW, LCSWA  06/18/2015, 9:54 AM

## 2015-06-18 NOTE — Progress Notes (Signed)
D/C instructions/transition record/suicide risk assessment/meds/follow-up appointments reviewed and given to patient, pt verbalized understanding, pt's belongings returned to pt, denies SI/HI/AVH. 

## 2015-06-18 NOTE — [facility] Suicide Risk Assessment (Signed)
[facility] INPATIENT:  Family/Significant Other Suicide Prevention Education  Suicide Prevention Education:  Education Completed; Sarah Walton (mother) has been identified by the patient as the family member/significant other with whom the patient will be residing, and identified as the person(s) who will aid the patient in the event of a mental health crisis (suicidal ideations/suicide attempt).  With written consent from the patient, the family member/significant other has been provided the following suicide prevention education, prior to the and/or following the discharge of the patient.  The suicide prevention education provided includes the following:  Suicide risk factors  Suicide prevention and interventions  National Suicide Hotline telephone number  [facility] assessment telephone number  [facility] Emergency Assistance 911  [facility] and/or Residential Mobile Crisis Unit telephone number  Request made of family/significant other to:  Remove weapons (e.g., guns, rifles, knives), all items previously/currently identified as safety concern.    Remove drugs/medications (over-the-counter, prescriptions, illicit drugs), all items previously/currently identified as a safety concern.  The family member/significant other verbalizes understanding of the suicide prevention education information provided.  The family member/significant other agrees to remove the items of safety concern listed above.  Sarah Walton MSW, LCSWA  06/18/2015, 9:52 AM

## 2015-06-18 NOTE — Discharge Summary (Signed)
Physician Discharge Summary Note  Patient:  Sarah Walton is an 40 y.o., female MRN:  [id number] DOB:  Jul 12, 1975 Patient phone:  [phone] (home)  Patient address:   [address] [address],  Total Time spent with patient: 30 minutes  Date of Admission:  06/11/2015 Date of Discharge: 06/18/15  Reason for Admission:  psychosis  Principal Problem: Schizophrenia [facility]) Discharge Diagnoses: Patient Active Problem List   Diagnosis Date Noted  . 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 Schizophrenia ([place]) [F20.9] 06/11/2015  . Psychosis [F29] 06/11/2015  History of Present Illness:  40 year old with schizophrenia admitted after being found wandering on [address]. Patient is disorganized and difficult to follow today. She states that she is here because "I was tired. Eerything from "lotteries to everything being one sided, to me coughing like crazy. Being sick and throwing up my hands."   She states that she can't remember when all of this started and then told a story about everyone thinking it's weird that she got her apartment back. She perseverated on this several times and related everything to her being amazed "that I got the exact same apartment back." Patient denied mental illness and states that she only has anxiety. She uses spiritual meditation to clear her head. She was taking "Nerve Blend 14 to relax you passion flower and valerian flower. That's probably what my spirit was picking up. "   States that she was a liitle combative and belligerent because "there are people in your families messing with folks, controlling their thoughts and what they do." She denied SI/HI.AVH but appears to respond to internal stimuli. Her shirt is on inside out.   She did not attend group  Per nursing Pt has been pleasant and cooperative but confused and disorganized. Pt needs frequent redirecting. Pt denies SI and A/V hallucinations but does appear to  be responding to internal stimuli at times.   Patient arrived on unit at 5:35pm. Patient anxious and suspicious. Denied AVH but noted to be responding. Patient was oriented to unit and rules. She verbalized understanding. Will offer PRN medications.       Per assessment note: Assessment Note  Sarah Walton is an 40 y.o. female that presents this date being brought in by [facility] police after patient was found wandering on campus. Patient was initially given medications on admission after patient attempted to leave ED which required EDP Tamera Punt MD) to write an IVC. Per EDP notes patient had been reported as a missing person which was filed yesterday. Patient does have a history of schizophrenia. Admission notes also stated patient's aunt was present on admission and reported patient has not been taking her medications. Per aunt, she's been hallucinating. Patient was a poor historian and very disorganized during assessment and was not time/place oriented. Patient denies any history of medication interventions but did state she has received services from the [place] although patient stated she cannot remember when she was there last. Patient admitted to [place] but denied any S/I or H/I. Patient did state she would see and hear "Divine Interventions" from God. Patient could not provide any collateral information or family contact numbers. Patient is alert and pleasant but is very disorganized. Patient denies any drug or alcohol use. She hasn't reported any suicidal ideations. Marian Sorrow RN stated in previous note: "Pt is not answering questions, but instead continues to speak about the evil spirits in her family. Pt sts "You can't control God, I'm sorry if  he is using me to get to you but you can't control God." Pt continues to walk out of room while being spoken to. Pt also sts "I'm hearing fire alarms go off but ya'll ain't leaving." Case was staffed with Reita Cliche DNP who recommended an inpatient admission per IVC as  appropriate bed placement is investigated.   Associated Signs/Symptoms: Depression Symptoms: pt denies any issues (Hypo) Manic Symptoms: Distractibility, Impulsivity, Anxiety Symptoms: denies Psychotic Symptoms: Hallucinations: she denies but appears to be responding to internal stimuli PTSD Symptoms: Negative Total Time spent with patient: 50 mins  Past Psychiatric History: anxiety, per patient. Schizophrenia per charts.   Past Medical History:  Past Medical History  Diagnosis Date  . Anxiety   . History of migraine headaches    History reviewed. No pertinent past surgical history.  Family History:  Family History  Problem Relation Age of Onset  . Anemia Mother    Family Psychiatric  History: mother has schizophrenia  Social History:  History  Alcohol Use No     History  Drug Use No    Social History   Social History  . Marital Status: Single    Spouse Name: N/A  . Number of Children: N/A  . Years of Education: N/A   Social History Main Topics  . Smoking status: Never Smoker   . Smokeless tobacco: None  . Alcohol Use: No  . Drug Use: No  . Sexual Activity: Not Asked   Other Topics Concern  . None   Social History Narrative    Hospital Course: This patient has a prior history of schizophrenia per chart. Looks that she was quite psychotic and agitated upon admission.  Today she tells me she feels a little sedated with the medication but denies any other side effects. She does recall having visual hallucinations prior to coming into the hospital but denies any other symptoms. This patient does not have a history of schizophrenia. Patient tells me she was admitted at the [facility] a few years ago where she was treated for anxiety.  Schizophrenia patient complains of sedation with hospital. She appears quite cooperative and calm today. She still appears somewhat hyper religious. She has very limited insight and there is some level of paranoia. Due to  sedation I started her on Abilify.   Patient has been doing well on Abilify 30 mg by mouth daily. She denies any side effects. I will order Abilify injectable 400 mg--she received the injection on 6/1  Poor allergies continue Claritin 10 mg a day  For insomnia: I will order trazodone 100 mg by mouth by mouth daily at bedtime when necessary. Pt slept 7 h last night  Diet regular  Precautions every 15 minute checks  Disposition patient will return back to her apartment in [place]  Follow-up patient will f/u with [facility]  Labs vitamin B12 and TSH are within the normal limits.  Plan to discharge today  Social worker to contact family  Today she denies having hallucinations, SI, HI or problems with mood, appetite, energy, sleep or concentration.  Denies SE from medications or physical complaints.  Patient has been calm, pleasant and cooperative.  During stay she participated in programming. She did not display any unsafe or disruptive behavior.  Physical Findings: AIMS: Facial and Oral Movements Muscles of Facial Expression: None, normal Lips and Perioral Area: None, normal Jaw: None, normal Tongue: None, normal,Extremity Movements Upper (arms, wrists, hands, fingers): None, normal Lower (legs, knees, ankles, toes): None, normal, Trunk Movements  Neck, shoulders, hips: None, normal, Overall Severity Severity of abnormal movements (highest score from questions above): None, normal Incapacitation due to abnormal movements: None, normal Patient's awareness of abnormal movements (rate only patient's report): No Awareness, Dental Status Current problems with teeth and/or dentures?: No Does patient usually wear dentures?: No  CIWA:    COWS:     Musculoskeletal: Strength & Muscle Tone: within normal limits Gait & Station: normal Patient leans: N/A  Psychiatric Specialty Exam: Physical Exam  Constitutional: She is oriented to person, place, and time. She appears well-developed  and well-nourished.  HENT:  Head: Normocephalic and atraumatic.  Neck: Normal range of motion.  Respiratory: Effort normal.  Musculoskeletal: Normal range of motion.  Neurological: She is alert and oriented to person, place, and time.    Review of Systems  Constitutional: Negative.   HENT: Negative.   Eyes: Negative.   Respiratory: Negative.   Cardiovascular: Negative.   Gastrointestinal: Negative.   Genitourinary: Negative.   Musculoskeletal: Negative.   Skin: Negative.   Neurological: Negative.   Endo/Heme/Allergies: Negative.   Psychiatric/Behavioral: Negative.     Blood pressure 123/82, pulse 87, temperature 99 F (37.2 C), temperature source Oral, resp. rate 18, height 5' (1.524 m), weight 73.936 kg (163 lb), SpO2 100 %.Body mass index is 31.83 kg/(m^2).  General Appearance: Fairly Groomed  Eye Contact:  Good  Speech:  Clear and Coherent  Volume:  Normal  Mood:  Euthymic  Affect:  Appropriate  Thought Process:  Descriptions of Associations: Loose  Orientation:  Full (Time, Place, and Person)  Thought Content:  Hallucinations: None  Suicidal Thoughts:  No  Homicidal Thoughts:  No  Memory:  Immediate;   Fair Recent;   Fair Remote;   Fair  Judgement:  Fair  Insight:  Shallow  Psychomotor Activity:  Normal  Concentration:  Concentration: Fair and Attention Span: Fair  Recall:  [employer] of Knowledge:  Fair  Language:  Good  Akathisia:  No  Handed:    AIMS (if indicated):     Assets:  Communication Skills Social Support  ADL's:  Intact  Cognition:  WNL  Sleep:  Number of Hours: 7.3     Have you used any form of tobacco in the last 30 days? (Cigarettes, Smokeless Tobacco, Cigars, and/or Pipes): No  Has this patient used any form of tobacco in the last 30 days? (Cigarettes, Smokeless Tobacco, Cigars, and/or Pipes) Yes, No  Blood Alcohol level:  Lab Results  Component Value Date   ETH <5 16/94/5038    Metabolic Disorder Labs:  Lab Results  Component  Value Date   HGBA1C 5.9 06/16/2015   Lab Results  Component Value Date   PROLACTIN 62.9* 06/16/2015   Lab Results  Component Value Date   CHOL 127 06/16/2015   TRIG 50 06/16/2015   HDL 34* 06/16/2015   CHOLHDL 3.7 06/16/2015   VLDL 10 06/16/2015   [place] 83 06/16/2015   Results for ARDELLA, CHHIM (MRN [id number]) as of 06/18/2015 06:50  Ref. Range 06/10/2015 20:41 06/10/2015 20:52 06/16/2015 07:12  Sodium Latest Ref Range: 135-145 mmol/L  136   Potassium Latest Ref Range: 3.5-5.1 mmol/L  3.5   Chloride Latest Ref Range: 101-111 mmol/L  100 (L)   CO2 Latest Ref Range: 22-32 mmol/L  18 (L)   BUN Latest Ref Range: 6-20 mg/dL  21 (H)   Creatinine Latest Ref Range: 0.44-1.00 mg/dL  1.02 (H)   Calcium Latest Ref Range: 8.9-10.3 mg/dL  9.1   EGFR (  Non-African Amer.) Latest Ref Range: >60 mL/min  >60   EGFR (African American) Latest Ref Range: >60 mL/min  >60   Glucose Latest Ref Range: 65-99 mg/dL  102 (H)   Anion gap Latest Ref Range: 5-15   18 (H)   Cholesterol Latest Ref Range: 0-200 mg/dL   127  Triglycerides Latest Ref Range: <150 mg/dL   50  HDL Cholesterol Latest Ref Range: >40 mg/dL   34 (L)  LDL (calc) Latest Ref Range: 0-99 mg/dL   83  VLDL Latest Ref Range: 0-40 mg/dL   10  Total CHOL/HDL Ratio Latest Units: RATIO   3.7  Vitamin B12 Latest Ref Range: 180-914 pg/mL   1469 (H)  WBC Latest Ref Range: 4.0-10.5 K/uL  7.9   RBC Latest Ref Range: 3.87-5.11 MIL/uL  4.77   Hemoglobin Latest Ref Range: 12.0-15.0 g/dL  12.8   HCT Latest Ref Range: 36.0-46.0 %  38.4   MCV Latest Ref Range: 78.0-100.0 fL  80.5   MCH Latest Ref Range: 26.0-34.0 pg  26.8   MCHC Latest Ref Range: 30.0-36.0 g/dL  33.3   RDW Latest Ref Range: 11.5-15.5 %  13.6   Platelets Latest Ref Range: 150-400 K/uL  308   Neutrophils Latest Units: %  61   Lymphocytes Latest Units: %  22   Monocytes Relative Latest Units: %  17   Eosinophil Latest Units: %  0   Basophil Latest Units: %  0   NEUT# Latest Ref Range:  1.7-7.7 K/uL  4.8   Lymphocyte # Latest Ref Range: 0.7-4.0 K/uL  1.7   Monocyte # Latest Ref Range: 0.1-1.0 K/uL  1.3 (H)   Eosinophils Absolute Latest Ref Range: 0.0-0.7 K/uL  0.0   Basophils Absolute Latest Ref Range: 0.0-0.1 K/uL  0.0   Acetaminophen (Tylenol), S Latest Ref Range: 10-30 ug/mL  <47 (L)   Salicylate Lvl Latest Ref Range: 2.8-30.0 mg/dL  <4.0   Prolactin Latest Ref Range: 4.8-23.3 ng/mL   62.9 (H)  Hemoglobin A1C Latest Ref Range: 4.0-6.0 %   5.9  Preg Test, Ur Latest Ref Range: NEGATIVE  NEGATIVE    TSH Latest Ref Range: 0.350-4.500 uIU/mL   1.674  Appearance Latest Ref Range: CLEAR  CLOUDY (A)    Bacteria, UA Latest Ref Range: NONE SEEN  FEW (A)    Bilirubin Urine Latest Ref Range: NEGATIVE  MODERATE (A)    Casts Latest Ref Range: NEGATIVE  HYALINE CASTS (A)    Color, Urine Latest Ref Range: YELLOW  AMBER (A)    Glucose Latest Ref Range: NEGATIVE mg/dL NEGATIVE    Hgb urine dipstick Latest Ref Range: NEGATIVE  TRACE (A)    Ketones, ur Latest Ref Range: NEGATIVE mg/dL >80 (A)    Leukocytes, UA Latest Ref Range: NEGATIVE  NEGATIVE    Nitrite Latest Ref Range: NEGATIVE  NEGATIVE    pH Latest Ref Range: 5.0-8.0  6.0    Protein Latest Ref Range: NEGATIVE mg/dL 100 (A)    RBC / HPF Latest Ref Range: 0-5 RBC/hpf 0-5    Specific Gravity, Urine Latest Ref Range: 1.005-1.030  1.035 (H)    Squamous Epithelial / LPF Latest Ref Range: NONE SEEN  TOO NUMEROUS TO C... (A)    Urine-Other Unknown MUCOUS PRESENT    WBC, UA Latest Ref Range: 0-5 WBC/hpf 0-5    Alcohol, Ethyl (B) Latest Ref Range: <5 mg/dL  <5   Amphetamines Latest Ref Range: NONE DETECTED  NONE DETECTED    Barbiturates Latest  Ref Range: NONE DETECTED  NONE DETECTED    Benzodiazepines Latest Ref Range: NONE DETECTED  NONE DETECTED    Opiates Latest Ref Range: NONE DETECTED  NONE DETECTED    COCAINE Latest Ref Range: NONE DETECTED  NONE DETECTED    Tetrahydrocannabinol Latest Ref Range: NONE DETECTED  NONE DETECTED       See Psychiatric Specialty Exam and Suicide Risk Assessment completed by Attending Physician prior to discharge.  Discharge destination:  Home  Is patient on multiple antipsychotic therapies at discharge:  Yes,   Do you recommend tapering to monotherapy for antipsychotics?  Yes   Has Patient had three or more failed trials of antipsychotic monotherapy by history:  No  Recommended Plan for Multiple Antipsychotic Therapies: Taper to monotherapy as described:  gradually decrease oral abilify     Medication List    TAKE these medications      Indication   ARIPiprazole 30 MG tablet  Commonly known as:  ABILIFY  Take 1 tablet (30 mg total) by mouth daily.  Notes to Patient:  schizophrenia      ARIPiprazole 400 MG Susr  Inject 400 mg into the muscle every 30 (thirty) days.  Start taking on:  07/15/2015   Indication:  Schizophrenia       Follow-up Information    Follow up with [facility] In 3 days.   Specialty:  Behavioral Health   Why:  Your hospital follow up appointment will be walk in. Walk in hours are Monday- Friday between 8:00am and 10:00am. Please take your ID and insurance information.    Contact information:   [place] [address] [phone]         Signed: Hildred Priest, MD 06/18/2015, 9:02 AM

## 2015-06-18 NOTE — Progress Notes (Signed)
Recreation Therapy Notes  Date: 06.02.17 Time: 9:30 am Location: Craft Room  Group Topic: Coping Skills  Goal Area(s) Addresses:  Patient will participate in healthy coping skill. Patient will verbalize benefit of using art as a coping skill.  Behavioral Response: Attentive, Interactive  Intervention: Coloring  Activity: Patients were instructed to color and to think about the emotions they were feeling as well as what they were focused on while they were coloring.  Education:LRT educated patients on healthy coping skills.  Education Outcome: Acknowledges education/In group clarification offered   Clinical Observations/Feedback: Patient completed activity by coloring her coloring sheet. Patient contributed to group discussion by stating what emotion she felt while she was coloring and what her mind was focused on while coloring.   Jacquelynn CreeGreene,Flynn Lininger M, LRT/CTRS 06/18/2015 10:29 AM

## 2015-06-18 NOTE — [facility] Suicide Risk Assessment (Addendum)
[facility]   Principal Problem: Schizophrenia [facility](HCC) Discharge Diagnoses:  Patient Active Problem List   Diagnosis Date Noted  . 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 Schizophrenia (HCC) [F20.9] 06/11/2015  . Psychosis [F29] 06/11/2015     Psychiatric Specialty Exam: ROS  Blood pressure 113/71, pulse 76, temperature 98.7 F (37.1 C), temperature source Oral, resp. rate 18, height 5' (1.524 m), weight 73.936 kg (163 lb), SpO2 100 %.Body mass index is 31.83 kg/(m^2).                                                       Mental Status Per Nursing Assessment::   On Admission:     Demographic Factors:  Living alone  Loss Factors: NA  Historical Factors: Family history of mental illness or substance abuse  Risk Reduction Factors:   Positive social support  Continued Clinical Symptoms:  Schizophrenia:   Paranoid or undifferentiated type Previous Psychiatric Diagnoses and Treatments  Cognitive Features That Contribute To Risk:  None    Suicide Risk:  Minimal: No identifiable suicidal ideation.  Patients presenting with no risk factors but with morbid ruminations; may be classified as minimal risk based on the severity of the depressive symptoms  Follow-up Information    [facility]   Specialty:  Behavioral Health   Why:  Your hospital follow up appointment will be walk in. Walk in hours are Monday- Friday between 8:00am and 10:00am. Please take your ID and insurance information.    Contact information:   [address] [place] [address] [phone]         Jimmy FootmanHernandez-Gonzalez,  Eyva Califano, MD 06/18/2015, 6:41 AM

## 2016-01-23 ENCOUNTER — Encounter (HOSPITAL_COMMUNITY): Payer: Self-pay

## 2016-01-23 ENCOUNTER — Emergency Department (HOSPITAL_COMMUNITY)
Admission: EM | Admit: 2016-01-23 | Discharge: 2016-01-28 | Disposition: A | Discharge status: Home / Self Care | Payer: BLUE CROSS/BLUE SHIELD | Attending: Emergency Medicine | Admitting: Emergency Medicine

## 2016-01-23 DIAGNOSIS — Z8489 Family history of other specified conditions: Secondary | ICD-10-CM | POA: Diagnosis not present

## 2016-01-23 DIAGNOSIS — J101 Influenza due to other identified influenza virus with other respiratory manifestations: Secondary | ICD-10-CM | POA: Diagnosis not present

## 2016-01-23 DIAGNOSIS — R410 Disorientation, unspecified: Secondary | ICD-10-CM | POA: Insufficient documentation

## 2016-01-23 DIAGNOSIS — F29 Unspecified psychosis not due to a substance or known physiological condition: Secondary | ICD-10-CM | POA: Diagnosis not present

## 2016-01-23 DIAGNOSIS — F203 Undifferentiated schizophrenia: Secondary | ICD-10-CM | POA: Diagnosis not present

## 2016-01-23 DIAGNOSIS — Z79899 Other long term (current) drug therapy: Secondary | ICD-10-CM | POA: Insufficient documentation

## 2016-01-23 DIAGNOSIS — F209 Schizophrenia, unspecified: Secondary | ICD-10-CM | POA: Diagnosis not present

## 2016-01-23 DIAGNOSIS — R000 Tachycardia, unspecified: Secondary | ICD-10-CM | POA: Insufficient documentation

## 2016-01-23 DIAGNOSIS — J111 Influenza due to unidentified influenza virus with other respiratory manifestations: Secondary | ICD-10-CM

## 2016-01-23 DIAGNOSIS — R443 Hallucinations, unspecified: Secondary | ICD-10-CM

## 2016-01-23 DIAGNOSIS — Z880 Allergy status to penicillin: Secondary | ICD-10-CM | POA: Diagnosis not present

## 2016-01-23 DIAGNOSIS — Z9114 Patient's other noncompliance with medication regimen: Secondary | ICD-10-CM | POA: Diagnosis not present

## 2016-01-23 LAB — CBC
HEMATOCRIT: 40.1 % (ref 36.0–46.0)
Hemoglobin: 13.7 g/dL (ref 12.0–15.0)
MCH: 27.3 pg (ref 26.0–34.0)
MCHC: 34.2 g/dL (ref 30.0–36.0)
MCV: 80 fL (ref 78.0–100.0)
PLATELETS: 273 10*3/uL (ref 150–400)
RBC: 5.01 MIL/uL (ref 3.87–5.11)
RDW: 13.3 % (ref 11.5–15.5)
WBC: 7.3 10*3/uL (ref 4.0–10.5)

## 2016-01-23 LAB — COMPREHENSIVE METABOLIC PANEL
ALBUMIN: 4.3 g/dL (ref 3.5–5.0)
ALT: 13 U/L — AB (ref 14–54)
AST: 34 U/L (ref 15–41)
Alkaline Phosphatase: 59 U/L (ref 38–126)
Anion gap: 15 (ref 5–15)
BUN: 11 mg/dL (ref 6–20)
CHLORIDE: 98 mmol/L — AB (ref 101–111)
CO2: 24 mmol/L (ref 22–32)
CREATININE: 0.91 mg/dL (ref 0.44–1.00)
Calcium: 9.8 mg/dL (ref 8.9–10.3)
GFR calc Af Amer: >60 mL/min (ref >60)
GFR calc non Af Amer: >60 mL/min (ref >60)
GLUCOSE: 104 mg/dL — AB (ref 65–99)
POTASSIUM: 4.1 mmol/L (ref 3.5–5.1)
Sodium: 137 mmol/L (ref 135–145)
Total Bilirubin: 0.5 mg/dL (ref 0.3–1.2)
Total Protein: 8.6 g/dL — ABNORMAL HIGH (ref 6.5–8.1)

## 2016-01-23 LAB — T4, FREE: FREE T4: 1.12 ng/dL (ref 0.61–1.12)

## 2016-01-23 LAB — I-STAT BETA HCG BLOOD, ED (MC, WL, AP ONLY): I-stat hCG, quantitative: <5 m[IU]/mL (ref <5)

## 2016-01-23 LAB — TSH: TSH: 2.143 u[IU]/mL (ref 0.350–4.500)

## 2016-01-23 MED ORDER — LORAZEPAM 1 MG PO TABS
1.0000 mg | ORAL_TABLET | Freq: Three times a day (TID) | ORAL | Status: DC | PRN
  Administered 2016-01-24: 1 mg via ORAL
  Filled 2016-01-23: qty 1

## 2016-01-23 MED ORDER — IBUPROFEN 400 MG PO TABS: 600.0000 mg | ORAL_TABLET | Freq: Three times a day (TID) | ORAL | Status: DC | PRN

## 2016-01-23 MED ORDER — SODIUM CHLORIDE 0.9 % IV BOLUS (SEPSIS)
1000.0000 mL | Freq: Once | INTRAVENOUS | Status: AC
  Administered 2016-01-23: 1000 mL via INTRAVENOUS

## 2016-01-23 NOTE — ED Notes (Signed)
Nurse drawing addl labs 

## 2016-01-23 NOTE — ED Notes (Signed)
IV access attempted x 3 by 2 different RN's. Per MD NS ordered d/t HR 133. If HR now lower we can reassess need for saline

## 2016-01-23 NOTE — ED Notes (Signed)
Ambulated pt to BR, unable to give urine sample.

## 2016-01-23 NOTE — ED Notes (Signed)
[facility], [facility] will be [facility] for [facility].

## 2016-01-23 NOTE — ED Notes (Signed)
REMAINS IN TRIAGE

## 2016-01-23 NOTE — BH Assessment (Addendum)
Tele Assessment Note   Sarah Walton is an 41 y.o. female who presents unaccompanied after being transferred from [facility] after being petitioned for IVC by family members. Pt has a diagnosis of schizophrenia and is currently not taking psychiatric medications. During tele-assessment Pt sat on the bed with a sheet over her head, slowly rocking back and forth. She said she was not going to talk because the sitter was wearing a head wrap. When the sitter left the Pt wouldn't remove the sheet and continued to refuse to speak. Per report from [facility], Pt has exhibited increasing bizarre behaviors for the past week. They report she "is losing touch with the world" and "talking out of her head." In triage, Pt reported she was "seeing butterflies and symbols." In triage Pt reported she was speaking to her cousin although no one was there, stating "she's telemed." Pt has a history of wandering, auditory hallucinations and disorganized thought process and was last psychiatrically hospitalized in May 2017 at [facility]. Family reports Pt's mother died in August 2017.   Diagnosis: Schizophrenia  Past Medical History:  Past Medical History:  Diagnosis Date  . Anxiety   . History of migraine headaches     History reviewed. No pertinent surgical history.  Family History:  Family History  Problem Relation Age of Onset  . Anemia Mother     Social History:  reports that she has never smoked. She has never used smokeless tobacco. She reports that she does not drink alcohol or use drugs.  Additional Social History:  Alcohol / Drug Use Pain Medications: See MAR Prescriptions: See MAR Over the Counter: See MAR History of alcohol / drug use?: No history of alcohol / drug abuse Longest period of sobriety (when/how long): NA  CIWA: CIWA-Ar BP: 139/92 Pulse Rate: (!) 130 COWS:    PATIENT STRENGTHS: (choose at least two) Average or above average intelligence Physical Health  Allergies:   Allergies  Allergen Reactions  . Penicillins Other (See Comments)    Childhood Has patient had a PCN reaction causing immediate rash, facial/tongue/throat swelling, SOB or lightheadedness with hypotension: n/a Has patient had a PCN reaction causing severe rash involving mucus membranes or skin necrosis: n/a Has patient had a PCN reaction that required hospitalization: n/a Has patient had a PCN reaction occurring within the last 10 years: n/a If all of the above answers are "NO", then may proceed with Cephalosporin use.     Home Medications:  (Not in a hospital admission)  OB/GYN Status:  No LMP recorded.  General Assessment Data [facility] T[facility] Assessment: In system Is this a Tele or Face-to-Face Assessment?: Tele Assessment Is this an Initial Assessment or a Re-assessment for this encounter?: Initial Assessment Marital status: Single Maiden name: NA Is patient pregnant?: No Pregnancy Status: No Living Arrangements: Parent Can pt return to current living arrangement?: Yes Admission Status: Involuntary Is patient capable of signing voluntary admission?: No Referral Source: Other Museum/gallery curator([facility]) Insurance type: Self-pay     Crisis Care Plan Living Arrangements: Parent Legal Guardian: Other: (Self) Name of Psychiatrist: [facility] Name of Therapist: None  Education Status Is patient currently in school?: No Current Grade: NA Highest grade of school patient has completed: NA Name of school: NA Contact person: NA  Risk to self with the past 6 months Suicidal Ideation: No (Unable to assess. Pt refuses to answer questions.) Has patient been a risk to self within the past 6 months prior to admission? : Other (comment) (  Unable to assess. Pt refuses to answer questions.) Suicidal Intent:  (Unable to assess. Pt refuses to answer questions.) Has patient had any suicidal intent within the past 6 months prior to admission? : Other (comment) (Unable to assess. Pt  refuses to answer questions.) Is patient at risk for suicide?: No (Unable to assess. Pt refuses to answer questions.) Suicidal Plan?:  (Unable to assess. Pt refuses to answer questions.) Has patient had any suicidal plan within the past 6 months prior to admission? : Other (comment) (Unable to assess. Pt refuses to answer questions.) Access to Means:  (Unable to assess. Pt refuses to answer questions.) What has been your use of drugs/alcohol within the last 12 months?: Unable to assess. Pt refuses to answer questions. Previous Attempts/Gestures: No (Unable to assess. Pt refuses to answer questions.) How many times?: 0 (Unable to assess. Pt refuses to answer questions.) Other Self Harm Risks: Unable to assess. Pt refuses to answer questions. Triggers for Past Attempts: Other (Comment) (Unable to assess. Pt refuses to answer questions.) Intentional Self Injurious Behavior:  (Unable to assess. Pt refuses to answer questions.) Family Suicide History: Unable to assess Recent stressful life event(s): Loss (Comment) (Mother died in September 06, 2015) Persecutory voices/beliefs?:  (Unable to assess. Pt refuses to answer questions.) Depression:  (Unable to assess. Pt refuses to answer questions.) Depression Symptoms:  (Unable to assess. Pt refuses to answer questions.) Substance abuse history and/or treatment for substance abuse?:  (Unable to assess. Pt refuses to answer questions.) Suicide prevention information given to non-admitted patients: Not applicable  Risk to Others within the past 6 months Homicidal Ideation:  (Unable to assess. Pt refuses to answer questions.) Does patient have any lifetime risk of violence toward others beyond the six months prior to admission? : Unknown (Unable to assess. Pt refuses to answer questions.) Thoughts of Harm to Others:  (Unable to assess. Pt refuses to answer questions.) Current Homicidal Intent:  (Unable to assess. Pt refuses to answer questions.) Current  Homicidal Plan:  (Unable to assess. Pt refuses to answer questions.) Access to Homicidal Means:  (Unable to assess. Pt refuses to answer questions.) Identified Victim: Unable to assess. Pt refuses to answer questions. History of harm to others?:  (Unable to assess. Pt refuses to answer questions.) Assessment of Violence:  (Unable to assess. Pt refuses to answer questions.) Violent Behavior Description: Unable to assess. Pt refuses to answer questions. Does patient have access to weapons?: No Criminal Charges Pending?: No Does patient have a court date: No Is patient on probation?: No  Psychosis Hallucinations:  (Unable to assess. Pt refuses to answer questions.) Delusions: Unspecified (See note)  Mental Status Report Appearance/Hygiene: Other (Comment) (Covered in sheet) Eye Contact: Other (Comment) (None) Motor Activity: Other (Comment) (Slowly rocking back and forth) Speech: Unable to assess Level of Consciousness: Unable to assess Mood: Other (Comment) (Unable to assess. Pt refuses to answer questions.) Affect: Unable to Assess Anxiety Level:  (Unable to assess. Pt refuses to answer questions.) Thought Processes: Irrelevant Judgement: Impaired Orientation: Unable to assess Obsessive Compulsive Thoughts/Behaviors: Unable to Assess  Cognitive Functioning Concentration: Unable to Assess Memory: Unable to Assess IQ: Average Insight: Unable to Assess Impulse Control: Unable to Assess Appetite:  (Unable to assess. Pt refuses to answer questions.) Weight Loss: 0 Weight Gain: 0 Sleep: Unable to Assess Total Hours of Sleep: 0 (Unable to assess. Pt refuses to answer questions.) Vegetative Symptoms: Unable to Assess  ADLScreening [facility] Assessment Services) Patient's cognitive ability adequate to safely complete daily activities?: Yes  Patient able to express need for assistance with ADLs?: Yes Independently performs ADLs?: Yes (appropriate for developmental age)  Prior Inpatient  Therapy Prior Inpatient Therapy: Yes Prior Therapy Dates: 05/2015 Prior Therapy Facilty/Provider(s): [facility] Reason for Treatment: Schizophrenia  Prior Outpatient Therapy Prior Outpatient Therapy: Yes Prior Therapy Dates: Current Prior Therapy Facilty/Provider(s): [facility] Reason for Treatment: Schizophrenia Does patient have an ACCT team?: No Does patient have Intensive In-House Services?  : No Does patient have [facility] services? : Yes Does patient have P4CC services?: No  ADL Screening (condition at time of admission) Patient's cognitive ability adequate to safely complete daily activities?: Yes Is the patient deaf or have difficulty hearing?: No Does the patient have difficulty seeing, even when wearing glasses/contacts?: No Does the patient have difficulty concentrating, remembering, or making decisions?: No Patient able to express need for assistance with ADLs?: Yes Does the patient have difficulty dressing or bathing?: No Independently performs ADLs?: Yes (appropriate for developmental age) Does the patient have difficulty walking or climbing stairs?: No Weakness of Legs: None Weakness of Arms/Hands: None       Abuse/Neglect Assessment (Assessment to be complete while patient is alone) Physical Abuse:  (Unable to assess. Pt refuses to answer questions.) Verbal Abuse:  (Unable to assess. Pt refuses to answer questions.) Sexual Abuse:  (Unable to assess. Pt refuses to answer questions.) Exploitation of patient/patient's resources:  (Unable to assess. Pt refuses to answer questions.) Self-Neglect:  (Unable to assess. Pt refuses to answer questions.)     Advance Directives (For Healthcare) Does Patient Have a Medical Advance Directive?: No Would patient like information on creating a medical advance directive?: No - Patient declined    Additional Information 1:1 In Past 12 Months?: No CIRT Risk: No Elopement Risk: No Does patient have medical clearance?: Yes      Disposition: Binnie Rail, AC at [facility], confirmed adult unit [place] currently at capacity. Gave clinical report to Nira Conn, NP who said Pt meets criteria for inpatient psychiatric treatment. TTS will contact appropriate facilities for placement. Notified Dr. Linwood Dibbles and Gladstone Lighter, RN of recommendation.  Disposition Initial Assessment Completed for this Encounter: Yes Disposition of Patient: Inpatient treatment program Type of inpatient treatment program: Adult   Pamalee Leyden, [facility], [facility], [facility] Triage Specialist [phone]   Pamalee Leyden 01/23/2016 8:55 PM

## 2016-01-23 NOTE — ED Provider Notes (Signed)
[facility]-EMERGENCY DEPT Provider Note   CSN: [id number] Arrival date & time: 01/23/16  1623     History   Chief Complaint Chief Complaint  Patient presents with  . Medical Clearance    HPI Sarah Walton is a 41 y.o. female.  HPI The patient presents to the emergency room for evaluation of confusion. She has a history of mental health problems. According to the EMS report patient [place] [facility] [employer] [place] [employer]. Her family place her on an involuntary commitment. She has been having acute confusion seeing butterflies in symbols. Patient denied any suicidal or homicidal ideation. When I asked the patient what brought her into the emergency room she said that her family is stupid. She told me she was tired and did not want to speak to me any more. She denied any specific complaints of chest pain or shortness of breath Past Medical History:  Diagnosis Date  . Anxiety   . History of migraine headaches     Patient Active Problem List   Diagnosis Date Noted  . 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 Schizophrenia (HCC) 06/11/2015  . Psychosis 06/11/2015    History reviewed. No pertinent surgical history.  OB History    No data available       Home Medications    Prior to Admission medications   Medication Sig Start Date End Date Taking? Authorizing Provider  ARIPiprazole (ABILIFY) 30 MG tablet Take 1 tablet (30 mg total) by mouth daily. Patient not taking: Reported on 01/23/2016 06/17/15   Jimmy FootmanAndrea Hernandez-Gonzalez, MD  ARIPiprazole 400 MG SUSR Inject 400 mg into the muscle every 30 (thirty) days. Patient not taking: Reported on 01/23/2016 07/15/15   Jimmy FootmanAndrea Hernandez-Gonzalez, MD    Family History Family History  Problem Relation Age of Onset  . Anemia Mother     Social History Social History  Substance Use Topics  . Smoking status: Never Smoker  . Smokeless tobacco: Never Used  . Alcohol use No     Allergies   Penicillins   Review of  Systems Review of Systems  All other systems reviewed and are negative.    Physical Exam Updated Vital Signs BP 138/94 (BP Location: Right Arm)   Pulse (!) 129   Temp 97.8 F (36.6 C) (Oral)   Resp 17   SpO2 100%   Physical Exam  Constitutional: She appears well-developed and well-nourished. No distress.  HENT:  Head: Normocephalic and atraumatic.  Right Ear: External ear normal.  Left Ear: External ear normal.  Eyes: Conjunctivae are normal. Right eye exhibits no discharge. Left eye exhibits no discharge. No scleral icterus.  Neck: Neck supple. No tracheal deviation present.  Cardiovascular: Regular rhythm and intact distal pulses.  Tachycardia present.   Pulmonary/Chest: Effort normal and breath sounds normal. No stridor. No respiratory distress. She has no wheezes. She has no rales.  Abdominal: Soft. Bowel sounds are normal. She exhibits no distension. There is no tenderness. There is no rebound and no guarding.  Musculoskeletal: She exhibits no edema or tenderness.  Neurological: She is alert. She has normal strength. No sensory deficit. Cranial nerve deficit: no gross deficits. She exhibits normal muscle tone. She displays no seizure activity. Coordination normal.  Skin: Skin is warm and dry. No rash noted.  Psychiatric: She has a normal mood and affect.  Nursing note and vitals reviewed.    ED Treatments / Results  Labs (all labs ordered are listed, but only abnormal results are displayed) Labs Reviewed  COMPREHENSIVE  METABOLIC PANEL - Abnormal; Notable for the following:       Result Value   Chloride 98 (*)    Glucose, Bld 104 (*)    Total Protein 8.6 (*)    ALT 13 (*)    All other components within normal limits  CBC  TSH  T4, FREE  ETHANOL  RAPID URINE DRUG SCREEN, HOSP PERFORMED  I-STAT BETA HCG BLOOD, ED ([facility], [facility], [facility] ONLY)    EKG  EKG Interpretation  Date/Time:  Sunday January 23 2016 17:26:43 EST Ventricular Rate:  127 PR Interval:  142 QRS  Duration: 78 QT Interval:  296 QTC Calculation: 430 R Axis:   -5 Text Interpretation:  Sinus tachycardia Low voltage QRS Borderline ECG Since last tracing rate faster Confirmed by Desiree Fleming  MD-J, Darby Fleeman ([id number]) on 01/23/2016 8:18:37 PM       Radiology No results found.  Procedures Procedures (including critical care time)  Medications Ordered in ED Medications  LORazepam (ATIVAN) tablet 1 mg (not administered)  ibuprofen (ADVIL,MOTRIN) tablet 600 mg (not administered)  sodium chloride 0.9 % bolus 1,000 mL (1,000 mLs Intravenous New Bag/Given 01/23/16 2256)     Initial Impression / Assessment and Plan / ED Course  I have reviewed the triage vital signs and the nursing notes.  Pertinent labs & imaging results that were available during my care of the patient were reviewed by me and considered in my medical decision making (see chart for details).  Clinical Course as of Jan 22 2345  Wynelle Link Jan 23, 2016  2027 Patient's initial exam is reassuring. She does have a sinus tachycardia however. Exact etiology at this point is unclear. She has a normal white count. She doesn't appear dehydrated. She denies any specific complaints. Screen is pending. I will add on thyroid panel and ordered IV fluids.  [JK]    Clinical Course User Index [JK] Linwood Dibbles, MD    Pt has a sinus tachycardia.  She is otherwise asymptomatic.  THyroid panel is normal.  UDS is pending.  She denies drug use but that could be contributing to her tachcyardia and mental disturbance.  Pt was assessed by [facility].  Inpatient treatment recommended.  Will monitor her tachcyardia overnight awaiting placement.  Final Clinical Impressions(s) / ED Diagnoses   Final diagnoses:  Hallucinations  Sinus tachycardia    New Prescriptions New Prescriptions   No medications on file     Linwood Dibbles, MD 01/23/16 2347

## 2016-01-23 NOTE — ED Triage Notes (Signed)
Pt presents to ED with [employer]. Pt is IVC'd. Pt sent [facility] from [facility]. Pt with acute confusion. Pt states "seeing butterflies and symbols." Pt denies any SI or HI. Pt denies any pain. Pt with no complaints. Pt speaking to cousin, states "She's telemed." No person present.

## 2016-01-24 ENCOUNTER — Emergency Department (HOSPITAL_COMMUNITY): Payer: BLUE CROSS/BLUE SHIELD

## 2016-01-24 LAB — I-STAT CHEM 8, ED
BUN: 8 mg/dL (ref 6–20)
CHLORIDE: 100 mmol/L — AB (ref 101–111)
Calcium, Ion: 1.08 mmol/L — ABNORMAL LOW (ref 1.15–1.40)
Creatinine, Ser: 0.8 mg/dL (ref 0.44–1.00)
Glucose, Bld: 123 mg/dL — ABNORMAL HIGH (ref 65–99)
HEMATOCRIT: 37 % (ref 36.0–46.0)
Hemoglobin: 12.6 g/dL (ref 12.0–15.0)
POTASSIUM: 3.9 mmol/L (ref 3.5–5.1)
SODIUM: 136 mmol/L (ref 135–145)
TCO2: 27 mmol/L (ref 0–100)

## 2016-01-24 LAB — URINALYSIS, ROUTINE W REFLEX MICROSCOPIC
BILIRUBIN URINE: NEGATIVE
Glucose, UA: NEGATIVE mg/dL
Ketones, ur: 80 mg/dL — AB
LEUKOCYTES UA: NEGATIVE
Nitrite: NEGATIVE
PH: 5 (ref 5.0–8.0)
Protein, ur: NEGATIVE mg/dL
SPECIFIC GRAVITY, URINE: 1.024 (ref 1.005–1.030)

## 2016-01-24 LAB — CBC
HEMATOCRIT: 35.7 % — AB (ref 36.0–46.0)
HEMOGLOBIN: 12.1 g/dL (ref 12.0–15.0)
MCH: 27.4 pg (ref 26.0–34.0)
MCHC: 33.9 g/dL (ref 30.0–36.0)
MCV: 80.8 fL (ref 78.0–100.0)
PLATELETS: 323 10*3/uL (ref 150–400)
RBC: 4.42 MIL/uL (ref 3.87–5.11)
RDW: 13.4 % (ref 11.5–15.5)
WBC: 7 10*3/uL (ref 4.0–10.5)

## 2016-01-24 LAB — I-STAT CG4 LACTIC ACID, ED
LACTIC ACID, VENOUS: 0.93 mmol/L (ref 0.5–1.9)
Lactic Acid, Venous: 2.4 mmol/L (ref 0.5–1.9)

## 2016-01-24 LAB — RAPID URINE DRUG SCREEN, HOSP PERFORMED
Amphetamines: NOT DETECTED
Barbiturates: NOT DETECTED
Benzodiazepines: NOT DETECTED
COCAINE: NOT DETECTED
OPIATES: NOT DETECTED
Tetrahydrocannabinol: NOT DETECTED

## 2016-01-24 LAB — ETHANOL

## 2016-01-24 MED ORDER — SODIUM CHLORIDE 0.9 % IV BOLUS (SEPSIS)
1000.0000 mL | Freq: Once | INTRAVENOUS | Status: AC
  Administered 2016-01-24: 1000 mL via INTRAVENOUS

## 2016-01-24 MED ORDER — METOCLOPRAMIDE HCL 10 MG PO TABS
10.0000 mg | ORAL_TABLET | Freq: Once | ORAL | Status: DC
  Filled 2016-01-24: qty 1

## 2016-01-24 MED ORDER — HALOPERIDOL LACTATE 5 MG/ML IJ SOLN: 5.0000 mg | Freq: Once | INTRAMUSCULAR | Status: DC

## 2016-01-24 MED ORDER — ACETAMINOPHEN 500 MG PO TABS
1000.0000 mg | ORAL_TABLET | Freq: Once | ORAL | Status: AC
  Administered 2016-01-24: 1000 mg via ORAL
  Filled 2016-01-24: qty 2

## 2016-01-24 MED ORDER — OLANZAPINE 5 MG PO TBDP: 5.0000 mg | ORAL_TABLET | Freq: Three times a day (TID) | ORAL | Status: DC | PRN

## 2016-01-24 MED ORDER — ACETAMINOPHEN 325 MG PO TABS
650.0000 mg | ORAL_TABLET | ORAL | Status: DC | PRN
  Administered 2016-01-24 – 2016-01-25 (×3): 650 mg via ORAL
  Filled 2016-01-24 (×4): qty 2

## 2016-01-24 MED ORDER — ONDANSETRON 4 MG PO TBDP
4.0000 mg | ORAL_TABLET | ORAL | Status: DC | PRN
  Administered 2016-01-24: 4 mg via ORAL

## 2016-01-24 MED ORDER — NICOTINE 21 MG/24HR TD PT24
21.0000 mg | MEDICATED_PATCH | Freq: Every day | TRANSDERMAL | Status: DC
  Filled 2016-01-24: qty 1

## 2016-01-24 MED ORDER — ARIPIPRAZOLE 5 MG PO TABS
15.0000 mg | ORAL_TABLET | Freq: Every day | ORAL | Status: DC
  Administered 2016-01-24 – 2016-01-27 (×4): 15 mg via ORAL
  Filled 2016-01-24 (×4): qty 1

## 2016-01-24 NOTE — Progress Notes (Signed)
Inpatient psychiatric treatment has been recommended for pt per TTS assessments. Pt's referral under review at:  [facility]- Shelly in intake states "patient looks appropriate but we need her UDS and drug screen and to see her heart rate trending down."  CSW informed Shelly pt's UDS and etoh results  will be  faxed once available and will send most  recent vitals.  [facility]- [facility]- per Yvetta Coderracy Duplin [place]  [facility]  Ilean SkillMeghan Onita Pfluger, MSW, LCSW Clinical Social Work, Disposition  01/24/2016 [phone]

## 2016-01-24 NOTE — ED Notes (Signed)
TTS completed this AM 

## 2016-01-24 NOTE — ED Notes (Signed)
EDP at bedside  

## 2016-01-24 NOTE — ED Notes (Signed)
EDP advised will speak with patient.

## 2016-01-24 NOTE — ED Notes (Signed)
Patient ambulated to restroom.

## 2016-01-24 NOTE — ED Notes (Signed)
Pt appears agitated at this time, IV infusing Pt states she will take PRN Ativan.  Sitter at bedside.

## 2016-01-24 NOTE — ED Notes (Signed)
EDP Issacs called made aware patient refusing blood work and IV

## 2016-01-24 NOTE — ED Notes (Signed)
Elevated CG-4 reported to Dr. Pen[facility]Issacs and [facility]

## 2016-01-24 NOTE — ED Provider Notes (Addendum)
Discussed with Psychiatry. Will re-start abilify 15 mg qHS and add Zyprexa 5 mg q8hr PRN per their recommendations. Safe to restart per Psych.  2:04 PM Notified by RN that patient is now febrile. On review of VS, she has been persistently tachycardic throughout the night and AM. On my exam, she clinically does not appear toxic or septic. She does endorse cough with sputum production. Will check influenza, CXR, UA. Will give antipyretic, fluids, and monitor HR.   6:09 PM Patient initially refusing all interventions, but agreeable after discussion. CXR reviewed and is clear. Repeat CBC, BMP are reassuring. She does have mild LA elevation of 2.4, likely 2/2 dehydration, and has now been given IVF. She has some mild nausea and vomited. Suspect this is all 2/2 underlying viral illness. That being said, she will need to be given IVF, monitored in ED for improvement in HR prior to psych disposition. Given well appearance, stable BP, reassuring repeat labs, do not feel ABX indicated. UA also without UTI. Her abdominal exam is soft, NT, ND, without focal TTp to suggest cholecystitis, appendicitis, diverticulitis, or other intra-abdominal pathology.   Shaune Pollackameron Jaelani Posa, MD 01/24/16 [phone]

## 2016-01-24 NOTE — BH Assessment (Signed)
Per Sonny Mastersandace, at [facility], patient's bed will be held until tomorrow morning. Megan with SW to call in the morning for follow up on patient status.

## 2016-01-24 NOTE — ED Notes (Signed)
EDP Issac called advised patient HR 140 and temp while resting. EDP advised will come assess patient.

## 2016-01-24 NOTE — BH Assessment (Signed)
Patient was re-assessed.  Patient is a 41 year old female that was not orientated to time, place or situation.  Patient reports that she is unaware as to how she came to the hospital.  Patient seemed to be responding to internal stimuli during the assessment.  Documentation in the epic chart on 01-23-2015, reports the following:  Patient presents unaccompanied after being [facility] after being petitioned for IVC by family members. Pt has a diagnosis of schizophrenia and is currently not taking psychiatric medications. During tele-assessment Pt sat on the bed with a sheet over her head, slowly rocking back and forth. She said she was not going to talk because the sitter was wearing a head wrap. When the sitter left the Pt wouldn't remove the sheet and continued to refuse to speak. Per [facility] from [facility], Pt has exhibited increasing bizarre behaviors for the past week. They report she "is losing touch with the world" and "talking out of her head." In triage, Pt reported she was "seeing butterflies and symbols." In triage Pt reported she was speaking to her cousin although no one was there, stating "she's telemed." Pt has a history of wandering, auditory hallucinations and disorganized thought process and was last psychiatrically hospitalized in May 2017 at [facility]. Family reports Pt's mother died in August 2017.   Per Claudette Headonrad Withrow, DNP - patient continues to meet criteria for inpatient hospitalization. CSw will continue to seek placement.

## 2016-01-24 NOTE — ED Notes (Signed)
BH called advised [facility] to [facility] patient then started vomiting per EDP patient not to be transferred at this time.

## 2016-01-24 NOTE — ED Notes (Signed)
Patient taken to the restroom by sitter.  Currently speaking with tele psych at this time.

## 2016-01-24 NOTE — ED Notes (Signed)
Patient given snack.  

## 2016-01-24 NOTE — BH Assessment (Signed)
Patient has been accepted to [facility].  Accepting physician is Dr. Shawnie DapperLopez Call report to [phone] Representative was Revonda StandardAllison Patient's Nurse, Jesse SansJanee' was informed of acceptance.

## 2016-01-24 NOTE — ED Notes (Signed)
EDP made aware of Lactic. Advised patient refused nausea medication./

## 2016-01-24 NOTE — ED Notes (Signed)
Lunch at bedside 

## 2016-01-24 NOTE — ED Notes (Signed)
Pt given ice water and refused snack.

## 2016-01-24 NOTE — ED Notes (Signed)
Patient became paranoid, when this RN started IV. Patient states This is government trying to get me. RN advised patient vital signs and that she was actively vomiting., patient states she I don't feel like I have to vomit  and threw medication on the floor.

## 2016-01-24 NOTE — ED Notes (Signed)
Dinner at bedside

## 2016-01-25 DIAGNOSIS — J101 Influenza due to other identified influenza virus with other respiratory manifestations: Secondary | ICD-10-CM

## 2016-01-25 DIAGNOSIS — F29 Unspecified psychosis not due to a substance or known physiological condition: Secondary | ICD-10-CM

## 2016-01-25 DIAGNOSIS — Z9114 Patient's other noncompliance with medication regimen: Secondary | ICD-10-CM

## 2016-01-25 DIAGNOSIS — F203 Undifferentiated schizophrenia: Secondary | ICD-10-CM | POA: Diagnosis not present

## 2016-01-25 LAB — INFLUENZA PANEL BY PCR (TYPE A & B)
INFLAPCR: POSITIVE — AB
INFLBPCR: NEGATIVE

## 2016-01-25 MED ORDER — OLANZAPINE 10 MG IM SOLR
5.0000 mg | Freq: Two times a day (BID) | INTRAMUSCULAR | Status: DC | PRN
Start: 2016-01-25 — End: 2016-01-28
  Filled 2016-01-25: qty 10

## 2016-01-25 MED ORDER — OSELTAMIVIR PHOSPHATE 75 MG PO CAPS
75.0000 mg | ORAL_CAPSULE | Freq: Two times a day (BID) | ORAL | Status: DC
  Administered 2016-01-25 – 2016-01-28 (×7): 75 mg via ORAL
  Filled 2016-01-25 (×10): qty 1

## 2016-01-25 MED ORDER — BENZONATATE 100 MG PO CAPS
100.0000 mg | ORAL_CAPSULE | Freq: Once | ORAL | Status: AC
  Administered 2016-01-25: 100 mg via ORAL
  Filled 2016-01-25: qty 1

## 2016-01-25 NOTE — ED Notes (Signed)
Spoke w/pt's Aunts: Ponsi and Katherine. Sarah LeatherwoodKatherine aware of tx plan - advised pt has been living w/her since her mother's death in 08/2015. States she would rather pt not to return to living w/her d/t pt noncompliant w/meds - she does not want pt to be made aware she does not want her to return.

## 2016-01-25 NOTE — ED Notes (Signed)
Dr Jonnalaggadda in w/pt. 

## 2016-01-25 NOTE — ED Notes (Signed)
Pt's Sarah Walton - [phone]- [phone] - called. Advised her pt has the flu and waiting for face-to-face psych eval. She advised she [facility] and that [facility] had advised her they would take pt back after cleared. [name], SW, [facility], aware.

## 2016-01-25 NOTE — ED Notes (Signed)
Obtained flu swab from pt - pt mostly cooperated w/much encouragement. Pt then returned to sleeping.

## 2016-01-25 NOTE — ED Notes (Signed)
Patient refused a snack. 

## 2016-01-25 NOTE — Consult Note (Signed)
[facility] Face-to-Face Psychiatry Consult   Reason for Consult:  Schizophrenia/psychosis Referring Physician:  Dr. Rainey Pines Patient Identification: Sarah Walton MRN:  [id number] Principal Diagnosis: Schizophrenia, undifferentiated ([place]) Diagnosis:   Patient Active Problem List   Diagnosis Date Noted  . Schizophrenia, undifferentiated ([place]) [F20.3] 06/11/2015  . Schizophrenia ([place]) [F20.9] 06/11/2015  . Psychosis [F29] 06/11/2015    Total Time spent with patient: 1 hour  Subjective:   Sarah Walton is a 41 y.o. female patient admitted with psychosis.  HPI:  Sarah Walton is an 41 y.o. female, seen, chart reviewed, case discussed with staff RN and Dr. Eulis Foster, EDPFor this face to face psychiatric evaluation and consultation of schizophrenia undifferentiated, noncompliant with medication management for several months and unable to keep in touch with the world and reality. Patient becomes nonfunctioning and unable to w care for herself. Patient was found with bizarre behaviors, wandering behaviors, responding to the internal stimuli and auditory/visual hallucinations. Patient was initially presented to [facility] with involuntary commitment by family members,  then referred to [facility] for treatment and stabilization of physical illness and also emotionally deteriorated. Reportedly she has been diagnosed with a schizophrenia, undifferentiated and was required inpatient psychiatric hospitalization at [facility] in May 2017. Reportedly patient mother was passed away in 18-Mar-2015 15-Sep-2022 and reportedly she was suffered with schizophrenia too. Patient is currently not taking psychiatric medications, she could not tell as how long she's been noncompliant with medication.   Below information from behavioral health assessment has been reviewed by me and I agreed with the findings. During tele-assessment Pt sat on the bed with a sheet over her head, slowly rocking back and forth. She said she  was not going to talk because the sitter was wearing a head wrap. When the sitter left the Pt wouldn't remove the sheet and continued to refuse to speak. Per report from [facility], Pt has exhibited increasing bizarre behaviors for the past week. They report she "is losing touch with the world" and "talking out of her head." In triage, Pt reported she was "seeing butterflies and symbols." In triage Pt reported she was speaking to her cousin although no one was there, stating "she's telemed." Pt has a history of wandering, auditory hallucinations and disorganized thought process and was last psychiatrically hospitalized in May 2017 at [facility]. Family reports Pt's mother died in 15-Sep-2015.   Past Psychiatric History: Schizophrenia, undifferentiated and noncompliant with medication management. Patient was treated with Abilify 30 mg daily and also monthly Abilify maintaina injections .  Risk to Self: Suicidal Ideation: No (Unable to assess. Pt refuses to answer questions.) Suicidal Intent:  (Unable to assess. Pt refuses to answer questions.) Is patient at risk for suicide?: No (Unable to assess. Pt refuses to answer questions.) Suicidal Plan?:  (Unable to assess. Pt refuses to answer questions.) Access to Means:  (Unable to assess. Pt refuses to answer questions.) What has been your use of drugs/alcohol within the last 12 months?: Unable to assess. Pt refuses to answer questions. How many times?: 0 (Unable to assess. Pt refuses to answer questions.) Other Self Harm Risks: Unable to assess. Pt refuses to answer questions. Triggers for Past Attempts: Other (Comment) (Unable to assess. Pt refuses to answer questions.) Intentional Self Injurious Behavior:  (Unable to assess. Pt refuses to answer questions.) Risk to Others: Homicidal Ideation:  (Unable to assess. Pt refuses to answer questions.) Thoughts of Harm to Others:  (Unable to assess. Pt refuses to answer questions.) Current  Homicidal Intent:   (Unable to assess. Pt refuses to answer questions.) Current Homicidal Plan:  (Unable to assess. Pt refuses to answer questions.) Access to Homicidal Means:  (Unable to assess. Pt refuses to answer questions.) Identified Victim: Unable to assess. Pt refuses to answer questions. History of harm to others?:  (Unable to assess. Pt refuses to answer questions.) Assessment of Violence:  (Unable to assess. Pt refuses to answer questions.) Violent Behavior Description: Unable to assess. Pt refuses to answer questions. Does patient have access to weapons?: No Criminal Charges Pending?: No Does patient have a court date: No Prior Inpatient Therapy: Prior Inpatient Therapy: Yes Prior Therapy Dates: 05/2015 Prior Therapy Facilty/Provider(s): [facility] Reason for Treatment: Schizophrenia Prior Outpatient Therapy: Prior Outpatient Therapy: Yes Prior Therapy Dates: Current Prior Therapy Facilty/Provider(s): [facility] Reason for Treatment: Schizophrenia Does patient have an ACCT team?: No Does patient have Intensive In-House Services?  : No Does patient have [facility] services? : Yes Does patient have P4CC services?: No  Past Medical History:  Past Medical History:  Diagnosis Date  . Anxiety   . History of migraine headaches    History reviewed. No pertinent surgical history. Family History:  Family History  Problem Relation Age of Onset  . Anemia Mother    Family Psychiatric  History: Patient mother was suffered with schizophrenia and died recently in September 21, 2015 as per chart. Social History:  History  Alcohol Use No     History  Drug Use No    Social History   Social History  . Marital status: Single    Spouse name: N/A  . Number of children: N/A  . Years of education: N/A   Social History Main Topics  . Smoking status: Never Smoker  . Smokeless tobacco: Never Used  . Alcohol use No  . Drug use: No  . Sexual activity: Not Asked   Other Topics Concern  . None   Social History  Narrative  . None   Additional Social History:    Allergies:   Allergies  Allergen Reactions  . Penicillins Other (See Comments)    Childhood Has patient had a PCN reaction causing immediate rash, facial/tongue/throat swelling, SOB or lightheadedness with hypotension: n/a Has patient had a PCN reaction causing severe rash involving mucus membranes or skin necrosis: n/a Has patient had a PCN reaction that required hospitalization: n/a Has patient had a PCN reaction occurring within the last 10 years: n/a If all of the above answers are "NO", then may proceed with Cephalosporin use.     Labs:  Results for orders placed or performed during the hospital encounter of 01/23/16 (from the past 48 hour(s))  Comprehensive metabolic panel     Status: Abnormal   Collection Time: 01/23/16  5:39 PM  Result Value Ref Range   Sodium 137 135 - 145 mmol/L   Potassium 4.1 3.5 - 5.1 mmol/L   Chloride 98 (L) 101 - 111 mmol/L   CO2 24 22 - 32 mmol/L   Glucose, Bld 104 (H) 65 - 99 mg/dL   BUN 11 6 - 20 mg/dL   Creatinine, Ser 0.91 0.44 - 1.00 mg/dL   Calcium 9.8 8.9 - 10.3 mg/dL   Total Protein 8.6 (H) 6.5 - 8.1 g/dL   Albumin 4.3 3.5 - 5.0 g/dL   AST 34 15 - 41 U/L   ALT 13 (L) 14 - 54 U/L   Alkaline Phosphatase 59 38 - 126 U/L   Total Bilirubin 0.5 0.3 - 1.2 mg/dL  GFR calc non Af Amer >60 >60 mL/min   GFR calc Af Amer >60 >60 mL/min    Comment: (NOTE) The eGFR has been calculated using the CKD EPI equation. This calculation has not been validated in all clinical situations. eGFR's persistently <60 mL/min signify possible Chronic Kidney Disease.    Anion gap 15 5 - 15  cbc     Status: None   Collection Time: 01/23/16  5:39 PM  Result Value Ref Range   WBC 7.3 4.0 - 10.5 K/uL   RBC 5.01 3.87 - 5.11 MIL/uL   Hemoglobin 13.7 12.0 - 15.0 g/dL   HCT 40.1 36.0 - 46.0 %   MCV 80.0 78.0 - 100.0 fL   MCH 27.3 26.0 - 34.0 pg   MCHC 34.2 30.0 - 36.0 g/dL   RDW 13.3 11.5 - 15.5 %    Platelets 273 150 - 400 K/uL  I-Stat Beta hCG blood, ED ([facility], [facility], [facility] only)     Status: None   Collection Time: 01/23/16  6:02 PM  Result Value Ref Range   I-stat hCG, quantitative <5.0 <5 mIU/mL   Comment 3            Comment:   GEST. AGE      CONC.  (mIU/mL)   <=1 WEEK        5 - 50     2 WEEKS       50 - 500     3 WEEKS       100 - 10,000     4 WEEKS     1,000 - 30,000        FEMALE AND NON-PREGNANT FEMALE:     LESS THAN 5 mIU/mL   TSH     Status: None   Collection Time: 01/23/16  9:40 PM  Result Value Ref Range   TSH 2.143 0.350 - 4.500 uIU/mL    Comment: Performed by a 3rd Generation assay with a functional sensitivity of <=0.01 uIU/mL.  T4, free     Status: None   Collection Time: 01/23/16  9:40 PM  Result Value Ref Range   Free T4 1.12 0.61 - 1.12 ng/dL    Comment: (NOTE) Biotin ingestion may interfere with free T4 tests. If the results are inconsistent with the TSH level, previous test results, or the clinical presentation, then consider biotin interference. If needed, order repeat testing after stopping biotin.   Rapid urine drug screen (hospital performed)     Status: None   Collection Time: 01/24/16  1:43 PM  Result Value Ref Range   Opiates NONE DETECTED NONE DETECTED   Cocaine NONE DETECTED NONE DETECTED   Benzodiazepines NONE DETECTED NONE DETECTED   Amphetamines NONE DETECTED NONE DETECTED   Tetrahydrocannabinol NONE DETECTED NONE DETECTED   Barbiturates NONE DETECTED NONE DETECTED    Comment:        DRUG SCREEN FOR MEDICAL PURPOSES ONLY.  IF CONFIRMATION IS NEEDED FOR ANY PURPOSE, NOTIFY LAB WITHIN 5 DAYS.        LOWEST DETECTABLE LIMITS FOR URINE DRUG SCREEN Drug Class       Cutoff (ng/mL) Amphetamine      1000 Barbiturate      200 Benzodiazepine   093 Tricyclics       818 Opiates          300 Cocaine          300 THC              50  Urinalysis, Routine w reflex microscopic     Status: Abnormal   Collection Time: 01/24/16  1:43 PM  Result Value  Ref Range   Color, Urine YELLOW YELLOW   APPearance HAZY (A) CLEAR   Specific Gravity, Urine 1.024 1.005 - 1.030   pH 5.0 5.0 - 8.0   Glucose, UA NEGATIVE NEGATIVE mg/dL   Hgb urine dipstick SMALL (A) NEGATIVE   Bilirubin Urine NEGATIVE NEGATIVE   Ketones, ur 80 (A) NEGATIVE mg/dL   Protein, ur NEGATIVE NEGATIVE mg/dL   Nitrite NEGATIVE NEGATIVE   Leukocytes, UA NEGATIVE NEGATIVE   RBC / HPF 0-5 0 - 5 RBC/hpf   WBC, UA 0-5 0 - 5 WBC/hpf   Bacteria, UA RARE (A) NONE SEEN   Squamous Epithelial / LPF 6-30 (A) NONE SEEN   Mucous PRESENT   Ethanol     Status: None   Collection Time: 01/24/16  1:56 PM  Result Value Ref Range   Alcohol, Ethyl (B) <5 <5 mg/dL    Comment:        LOWEST DETECTABLE LIMIT FOR SERUM ALCOHOL IS 5 mg/dL FOR MEDICAL PURPOSES ONLY   CBC     Status: Abnormal   Collection Time: 01/24/16  2:04 PM  Result Value Ref Range   WBC 7.0 4.0 - 10.5 K/uL   RBC 4.42 3.87 - 5.11 MIL/uL   Hemoglobin 12.1 12.0 - 15.0 g/dL   HCT 35.7 (L) 36.0 - 46.0 %   MCV 80.8 78.0 - 100.0 fL   MCH 27.4 26.0 - 34.0 pg   MCHC 33.9 30.0 - 36.0 g/dL   RDW 13.4 11.5 - 15.5 %   Platelets 323 150 - 400 K/uL  I-Stat CG4 Lactic Acid, ED     Status: Abnormal   Collection Time: 01/24/16  4:05 PM  Result Value Ref Range   Lactic Acid, Venous 2.40 (HH) 0.5 - 1.9 mmol/L   Comment NOTIFIED PHYSICIAN   I-Stat Chem 8, ED     Status: Abnormal   Collection Time: 01/24/16  4:05 PM  Result Value Ref Range   Sodium 136 135 - 145 mmol/L   Potassium 3.9 3.5 - 5.1 mmol/L   Chloride 100 (L) 101 - 111 mmol/L   BUN 8 6 - 20 mg/dL   Creatinine, Ser 0.80 0.44 - 1.00 mg/dL   Glucose, Bld 123 (H) 65 - 99 mg/dL   Calcium, Ion 1.08 (L) 1.15 - 1.40 mmol/L   TCO2 27 0 - 100 mmol/L   Hemoglobin 12.6 12.0 - 15.0 g/dL   HCT 37.0 36.0 - 46.0 %  I-Stat CG4 Lactic Acid, ED     Status: None   Collection Time: 01/24/16 10:15 PM  Result Value Ref Range   Lactic Acid, Venous 0.93 0.5 - 1.9 mmol/L  Influenza panel by  PCR (type A & B, H1N1)     Status: Abnormal   Collection Time: 01/25/16  8:25 AM  Result Value Ref Range   Influenza A By PCR POSITIVE (A) NEGATIVE   Influenza B By PCR NEGATIVE NEGATIVE    Comment: (NOTE) The Xpert Xpress Flu assay is intended as an aid in the diagnosis of  influenza and should not be used as a sole basis for treatment.  This  assay is FDA approved for nasopharyngeal swab specimens only. Nasal  washings and aspirates are unacceptable for Xpert Xpress Flu testing.     Current Facility-Administered Medications  Medication Dose Route Frequency Provider Last Rate Last Dose  . acetaminophen (TYLENOL)  tablet 650 mg  650 mg Oral Q4H PRN Antonietta Breach, PA-C   650 mg at 01/25/16 1740  . ARIPiprazole (ABILIFY) tablet 15 mg  15 mg Oral QHS Duffy Bruce, MD   15 mg at 01/24/16 2127  . ibuprofen (ADVIL,MOTRIN) tablet 600 mg  600 mg Oral Q8H PRN Dorie Rank, MD      . LORazepam (ATIVAN) tablet 1 mg  1 mg Oral Q8H PRN Dorie Rank, MD   1 mg at 01/24/16 0123  . metoCLOPramide (REGLAN) tablet 10 mg  10 mg Oral Once Duffy Bruce, MD      . OLANZapine zydis (ZYPREXA) disintegrating tablet 5 mg  5 mg Oral Q8H PRN Duffy Bruce, MD      . ondansetron (ZOFRAN-ODT) disintegrating tablet 4 mg  4 mg Oral Q4H PRN Charlesetta Shanks, MD   4 mg at 01/24/16 2326   Current Outpatient Prescriptions  Medication Sig Dispense Refill  . ARIPiprazole (ABILIFY) 30 MG tablet Take 1 tablet (30 mg total) by mouth daily. (Patient not taking: Reported on 01/23/2016) 30 tablet 0  . ARIPiprazole 400 MG SUSR Inject 400 mg into the muscle every 30 (thirty) days. (Patient not taking: Reported on 01/23/2016) 1 each 0    Musculoskeletal: Strength & Muscle Tone: decreased Gait & Station: unable to stand Patient leans: N/A  Psychiatric Specialty Exam: Physical Exam Full physical performed in Emergency Department. I have reviewed this assessment and concur with its findings.   ROS complaining about generalized weakness  tired, fatigued and multiple flu symptoms. Patient denied nausea, vomiting, abdomen pain shortness of breath and chest.  No Fever-chills, No Headache, No changes with Vision or hearing, reports vertigo No problems swallowing food or Liquids, No Chest pain, Cough or Shortness of Breath, No Abdominal pain, No Nausea or Vommitting, Bowel movements are regular, No Blood in stool or Urine, No dysuria, No new skin rashes or bruises, No new joints pains-aches,  No new weakness, tingling, numbness in any extremity, No recent weight gain or loss, No polyuria, polydypsia or polyphagia,   A full 10 point Review of Systems was done, except as stated above, all other Review of Systems were negative.  Blood pressure 116/71, pulse 100, temperature 99.1 F (37.3 C), temperature source Oral, resp. rate 16, SpO2 100 %.There is no height or weight on file to calculate BMI.  General Appearance: Disheveled and Guarded  Eye Contact:  Minimal  Speech:  Clear and Coherent and Slow  Volume:  Decreased  Mood:  Anxious and Depressed  Affect:  Depressed and Flat  Thought Process:  Coherent and Goal Directed  Orientation:  Full (Time, Place, and Person)  Thought Content:  WDL  Suicidal Thoughts:  No  Homicidal Thoughts:  No  Memory:  Immediate;   Fair Recent;   Fair Remote;   Poor  Judgement:  Impaired  Insight:  Fair  Psychomotor Activity:  Psychomotor Retardation  Concentration:  Concentration: Fair and Attention Span: Poor  Recall:  Poor  Fund of Knowledge:  Fair  Language:  Fair  Akathisia:  Negative  Handed:  Right  AIMS (if indicated):     Assets:  Communication Skills Desire for Improvement Financial Resources/Insurance Housing Leisure Time Resilience Social Support  ADL's:  Impaired  Cognition:  WNL  Sleep:        Treatment Plan Summary: 41 years old female with a history of schizophrenia admitted to [facility] emergency department with flu symptoms and positive for influenza. Patient  has been noncompliant with the psychiatric  medication and become unstable psychiatrically which required inpatient psychiatric hospitalization for crisis stabilization. She is not appropriate for psychiatric floor at this time and recommended treatment for influenza and also starts psychiatric medication Abilify 15 mg at bedtime and follow-up with her regularly and reevaluate for possible psychiatric admission  Medication management: Abilify 15 mg at bedtime daily and also Zyprexa 5 mg twice daily as needed for agitation and aggressive behavior or noncompliant with oral medications  Will contact patient family regarding collateral information and also [facility] regarding possibility of long-term injectable antipsychotic medication like Abilify maintaina  Daily contact with patient to assess and evaluate symptoms and progress in treatment and Medication management   Appreciate psychiatric consultation and follow up as clinically required Please contact [phone] or [phone] if needs further assistance  Disposition: My evaluation patient is met psychiatric unstable medically ready sick and cannot care for herself Patient needed reevaluation of psychiatric inpatient needs when medically stable Supportive therapy provided about ongoing stressors.  Ambrose Finland, MD 01/25/2016 1:26 PM

## 2016-01-25 NOTE — ED Provider Notes (Signed)
I evaluated the patient today, to help with disposition. She was seen by the psychiatrist, Dr. Elsie SaasJonnalagadda. He states that she is still unstable psychiatrically and needs further psychiatric care. She  remains under involuntary commitment. She has been diagnosed with influenza. She has no increased risk factors for influenza, type B.   At this time, the patient, admits to cough, but does not specify how long it has been present. She states that she is sleepy.  14:25- patient's case discussed with infectious disease, Dr. Cliffton AstersJohn Campbell, who recommends initiating Tamiflu despite her being low risk for complications. He states that if she receives a five-day course, of Tamiflu, she can be considered noninfectious. Therefore Tamiflu order for 5 days has been initiated.   Medications  LORazepam (ATIVAN) tablet 1 mg (1 mg Oral Given 01/24/16 0123)  ibuprofen (ADVIL,MOTRIN) tablet 600 mg (not administered)  acetaminophen (TYLENOL) tablet 650 mg (650 mg Oral Given 01/25/16 0702)  ARIPiprazole (ABILIFY) tablet 15 mg (15 mg Oral Given 01/24/16 2127)  metoCLOPramide (REGLAN) tablet 10 mg (10 mg Oral Refused 01/24/16 1607)  ondansetron (ZOFRAN-ODT) disintegrating tablet 4 mg (4 mg Oral Given 01/24/16 2326)  OLANZapine (ZYPREXA) injection 5 mg (not administered)  sodium chloride 0.9 % bolus 1,000 mL (0 mLs Intravenous Stopped 01/24/16 0305)  sodium chloride 0.9 % bolus 1,000 mL (0 mLs Intravenous Stopped 01/24/16 1710)  sodium chloride 0.9 % bolus 1,000 mL (0 mLs Intravenous Stopped 01/24/16 1758)  acetaminophen (TYLENOL) tablet 1,000 mg (1,000 mg Oral Given 01/24/16 1838)    Patient Vitals for the past 24 hrs:  BP Temp Temp src Pulse Resp SpO2  01/25/16 1240 116/71 99.1 F (37.3 C) Oral 100 16 100 %  01/25/16 0813 - 99.8 F (37.7 C) Oral - - -  01/25/16 0655 101/65 102.2 F (39 C) Oral 92 23 98 %  01/24/16 2213 - 99.2 F (37.3 C) - - - -  01/24/16 1735 122/85 99.8 F (37.7 C) Oral 116 20 99 %  01/24/16 1635  117/74 - - 120 - 100 %  01/24/16 1537 114/71 - - 101 18 96 %  01/24/16 1457 - 100 F (37.8 C) Oral - - -  01/24/16 1438 108/59 - - (!) 130 22 100 %   Results for orders placed or performed during the hospital encounter of 01/23/16  Comprehensive metabolic panel  Result Value Ref Range   Sodium 137 135 - 145 mmol/L   Potassium 4.1 3.5 - 5.1 mmol/L   Chloride 98 (L) 101 - 111 mmol/L   CO2 24 22 - 32 mmol/L   Glucose, Bld 104 (H) 65 - 99 mg/dL   BUN 11 6 - 20 mg/dL   Creatinine, Ser 0.980.91 0.44 - 1.00 mg/dL   Calcium 9.8 8.9 - 11.910.3 mg/dL   Total Protein 8.6 (H) 6.5 - 8.1 g/dL   Albumin 4.3 3.5 - 5.0 g/dL   AST 34 15 - 41 U/L   ALT 13 (L) 14 - 54 U/L   Alkaline Phosphatase 59 38 - 126 U/L   Total Bilirubin 0.5 0.3 - 1.2 mg/dL   GFR calc non Af Amer >60 >60 mL/min   GFR calc Af Amer >60 >60 mL/min   Anion gap 15 5 - 15  cbc  Result Value Ref Range   WBC 7.3 4.0 - 10.5 K/uL   RBC 5.01 3.87 - 5.11 MIL/uL   Hemoglobin 13.7 12.0 - 15.0 g/dL   HCT 14.740.1 82.936.0 - 56.246.0 %   MCV 80.0 78.0 -  100.0 fL   MCH 27.3 26.0 - 34.0 pg   MCHC 34.2 30.0 - 36.0 g/dL   RDW 45.4 09.8 - 11.9 %   Platelets 273 150 - 400 K/uL  Rapid urine drug screen (hospital performed)  Result Value Ref Range   Opiates NONE DETECTED NONE DETECTED   Cocaine NONE DETECTED NONE DETECTED   Benzodiazepines NONE DETECTED NONE DETECTED   Amphetamines NONE DETECTED NONE DETECTED   Tetrahydrocannabinol NONE DETECTED NONE DETECTED   Barbiturates NONE DETECTED NONE DETECTED  TSH  Result Value Ref Range   TSH 2.143 0.350 - 4.500 uIU/mL  T4, free  Result Value Ref Range   Free T4 1.12 0.61 - 1.12 ng/dL  Ethanol  Result Value Ref Range   Alcohol, Ethyl (B) <5 <5 mg/dL  Urinalysis, Routine w reflex microscopic  Result Value Ref Range   Color, Urine YELLOW YELLOW   APPearance HAZY (A) CLEAR   Specific Gravity, Urine 1.024 1.005 - 1.030   pH 5.0 5.0 - 8.0   Glucose, UA NEGATIVE NEGATIVE mg/dL   Hgb urine dipstick SMALL (A)  NEGATIVE   Bilirubin Urine NEGATIVE NEGATIVE   Ketones, ur 80 (A) NEGATIVE mg/dL   Protein, ur NEGATIVE NEGATIVE mg/dL   Nitrite NEGATIVE NEGATIVE   Leukocytes, UA NEGATIVE NEGATIVE   RBC / HPF 0-5 0 - 5 RBC/hpf   WBC, UA 0-5 0 - 5 WBC/hpf   Bacteria, UA RARE (A) NONE SEEN   Squamous Epithelial / LPF 6-30 (A) NONE SEEN   Mucous PRESENT   Influenza panel by PCR (type A & B, H1N1)  Result Value Ref Range   Influenza A By PCR POSITIVE (A) NEGATIVE   Influenza B By PCR NEGATIVE NEGATIVE  CBC  Result Value Ref Range   WBC 7.0 4.0 - 10.5 K/uL   RBC 4.42 3.87 - 5.11 MIL/uL   Hemoglobin 12.1 12.0 - 15.0 g/dL   HCT 14.7 (L) 82.9 - 56.2 %   MCV 80.8 78.0 - 100.0 fL   MCH 27.4 26.0 - 34.0 pg   MCHC 33.9 30.0 - 36.0 g/dL   RDW 13.0 86.5 - 78.4 %   Platelets 323 150 - 400 K/uL  I-Stat Beta hCG blood, ED ([facility], [facility], [facility] only)  Result Value Ref Range   I-stat hCG, quantitative <5.0 <5 mIU/mL   Comment 3          I-Stat CG4 Lactic Acid, ED  Result Value Ref Range   Lactic Acid, Venous 2.40 (HH) 0.5 - 1.9 mmol/L   Comment NOTIFIED PHYSICIAN   I-Stat Chem 8, ED  Result Value Ref Range   Sodium 136 135 - 145 mmol/L   Potassium 3.9 3.5 - 5.1 mmol/L   Chloride 100 (L) 101 - 111 mmol/L   BUN 8 6 - 20 mg/dL   Creatinine, Ser 6.96 0.44 - 1.00 mg/dL   Glucose, Bld 295 (H) 65 - 99 mg/dL   Calcium, Ion 2.84 (L) 1.15 - 1.40 mmol/L   TCO2 27 0 - 100 mmol/L   Hemoglobin 12.6 12.0 - 15.0 g/dL   HCT 13.2 44.0 - 10.2 %  I-Stat CG4 Lactic Acid, ED  Result Value Ref Range   Lactic Acid, Venous 0.93 0.5 - 1.9 mmol/L         Mancel Bale, MD 01/25/16 1836

## 2016-01-25 NOTE — ED Notes (Signed)
Per Dr Effie ShyWentz and Maralyn SagoSarah, CM - pt does not meet inpt criteria - plan is to treat pt w/Tamiflu x 5 days then pt may go to psych facility if still warranted.

## 2016-01-25 NOTE — ED Notes (Signed)
Per Jetta LoutMike, [facility] - removing pt from being accepted d/t pt w/fever and possible flu.

## 2016-01-25 NOTE — ED Notes (Signed)
Per Marland McalpineHannah, SW, [facility] - Dr Oneta RackJonnalaggadda to assess pt.

## 2016-01-25 NOTE — ED Notes (Signed)
A regular diet ordered for pt. For lunch.

## 2016-01-25 NOTE — ED Notes (Signed)
Encouraging pt to drink po fluids. Pt states she does not want to eat food. Water and Ginger Ale given to pt.

## 2016-01-25 NOTE — Progress Notes (Signed)
Requested face to face to see patient due to behaviors and refusal and also rule out acute delirium after case was discussed in bed meeting.  [facility] to notify Dr. Shela CommonsJ will be coming to see patient.  Dr. Shela CommonsJ called with consult at 9:30am.  Will follow up regarding disposition.  Patient has current fever and removed [facility] bed status list [facility].  Once fever and medical status addressed, will reconsider.   Appears by notes that patient is more cooperative today with medical compliance.  Sarah EmoryHannah Glora Hulgan LCSW, MSW Clinical Social Work: Optician, dispensingystem [employer] Coverage for :  [phone]

## 2016-01-25 NOTE — ED Notes (Signed)
Dr Effie ShyWentz aware flu positive - ordered to encourage po fluids and administer Tylenol for fever.

## 2016-01-26 NOTE — ED Notes (Signed)
Family member called to get an update on pt.  Pt asleep.

## 2016-01-26 NOTE — ED Notes (Signed)
TTS reassessment being completed at bedside.

## 2016-01-26 NOTE — ED Notes (Signed)
Regular Diet Ordered for patient for lunch. It was Tomato Soup, Crackers, Tribune Companyrange Juice, Jello, and Ginger Ale.

## 2016-01-26 NOTE — ED Notes (Signed)
Pt given ginger ale per request

## 2016-01-26 NOTE — ED Notes (Signed)
Pt awake and wanting to speak to her siblings.

## 2016-01-26 NOTE — ED Notes (Signed)
Pt concerned that no one contacted her aunt, pt was reassured that both of her aunts were contacted during day shift, and were unable to come to visit due to being sick.

## 2016-01-26 NOTE — Progress Notes (Signed)
CSW received phone call from Kennon HolterKatherine Harrelson alleging to be Patient's aunt. Mrs. Justice BritainHarrelson requesting update on Patient. CSW informed Mrs. Harrelson that she is unable to share any information with her but that she is more than welcome to visit Patient during visiting hours. Pod F visiting hours provided to Mrs. Harrelson. CSW signing off.    Enos FlingAshley Zanaiya Calabria, MSW, LCSW [facility] ED/63M Clinical Social Worker [phone]

## 2016-01-26 NOTE — ED Notes (Signed)
Patient was given a Ginger ale.

## 2016-01-26 NOTE — ED Notes (Signed)
Pt used phone to call family

## 2016-01-26 NOTE — BHH Counselor (Addendum)
Re-assessment- Pt appears unaware of why she is hospitalized and previous psychiatric behaviors.. Pt denies SI/HI and AVH. Pt denies previous AVH and Schizophrenia diagnosis.  Pt awaiting placement after flu diagnosis resolved.  Sarah PhoenixBrandi Rontavious Albright, [facility] Triage Specialist

## 2016-01-27 NOTE — B[facility] Counselor (Signed)
Re-assessment- Pt denies SI/HI and AVH. Pt concerned about her family not visiting her.  Pt states she is physically feeling better.  Wolfgang PhoenixBrandi Thadeus Gandolfi, [facility] Triage Specialist

## 2016-01-27 NOTE — ED Notes (Signed)
Remains  On droplet precautions

## 2016-01-27 NOTE — Progress Notes (Signed)
CSW notified disposition CSW that per MD, Patient has been afebrile for over 24 hrs and can be transferred to another facility and complete course of Tamiflu. Patient ready for psych placement at this time.    Enos FlingAshley Timberlynn Kizziah, MSW, LCSW [facility]M Clinical Social Worker [phone]

## 2016-01-27 NOTE — ED Notes (Signed)
Patient eating lunch.

## 2016-01-27 NOTE — ED Notes (Signed)
Pt asking to use phone.  Pt given phone to use due to droplet precautions.  Pt instructed to return phone when done.  Pt verbalized understanding.

## 2016-01-27 NOTE — ED Notes (Signed)
Dinner tray delivered.

## 2016-01-27 NOTE — ED Notes (Signed)
Regular diet Ordered for Lunch.

## 2016-01-27 NOTE — ED Notes (Signed)
Spoke with EDP about patient Temp and HR, advised no Temp at this time.

## 2016-01-27 NOTE — ED Provider Notes (Signed)
Pt with positive flu. Has been on tamiflu for days. Afebrile for over 24 hours. She does not have flu like symptoms. I feel she can be transferred to another facility and complete course of tamiflu.     Raeford RazorStephen Gwyn Hieronymus, MD 01/27/16 1215

## 2016-01-28 DIAGNOSIS — Z79899 Other long term (current) drug therapy: Secondary | ICD-10-CM

## 2016-01-28 DIAGNOSIS — F203 Undifferentiated schizophrenia: Secondary | ICD-10-CM

## 2016-01-28 DIAGNOSIS — Z8489 Family history of other specified conditions: Secondary | ICD-10-CM

## 2016-01-28 DIAGNOSIS — Z880 Allergy status to penicillin: Secondary | ICD-10-CM

## 2016-01-28 MED ORDER — OSELTAMIVIR PHOSPHATE 75 MG PO CAPS: 75.0000 mg | ORAL_CAPSULE | Freq: Two times a day (BID) | ORAL | 0 refills | Status: DC

## 2016-01-28 MED ORDER — ARIPIPRAZOLE 15 MG PO TABS: 15.0000 mg | ORAL_TABLET | Freq: Every day | ORAL | 0 refills | Status: DC

## 2016-01-28 NOTE — Progress Notes (Signed)
CSW received phone call from Patient's aunt and emergency contact, Susanne Bordersoncilla Williams, very upset that she is now being asked for a code to receive updates from the RN on her niece's care. Ms. Mayford KnifeWilliams reports that she has been receiving updates all week and has talked to her niece all week and was never notified of a code. Patient unaware of a code that had been set up.   CSW discussed with RN who reports that Patient's code is supposed to be the last 4 of Patient's MRN. CSW informed Patient who provided her aunt with code and Patient also informed RN that she gives permission for updates to be provided to [employer] Inc[employer]on[employer]illa Williams.    Enos FlingAshley Jonetta Dagley, MSW, LCSW [facility]/55M Clinical Social Worker [phone]

## 2016-01-28 NOTE — ED Notes (Signed)
Papers reviewed with patient and she verbalizes understanding. Belongings returned, IVC removal faxed to magistrate and patient called her ride

## 2016-01-28 NOTE — Consult Note (Signed)
[facility]   Reason for Consult:  Schizophrenia/psychosis Referring Physician:  Dr. Durenda HurtKumar/EDP Patient Identification: Sarah HawthorneYolanda M Walton MRN:  [id number] Principal Diagnosis: Schizophrenia, undifferentiated (HCC) Diagnosis:   Patient Active Problem List   Diagnosis Date Noted  . 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 Schizophrenia (HCC) [F20.9] 06/11/2015  . Psychosis [F29] 06/11/2015    Total Time spent with patient: 30 minutes  Subjective:   Sarah Walton is a 41 y.o. female patient admitted with psychosis. Pt seen and chart reviewed. Pt is alert/oriented x4, calm, cooperative, and appropriate to situation. Pt denies suicidal/homicidal ideation and psychosis and does not appear to be responding to internal stimuli. Pt reports that she feels "much more clear" today and that she feels her primary problems have resulted from bereavement of her mother's death in August 2017 along with medication non-compliance with her last Abilify shot in May of 2017. Pt reports that "the last few days have been a blur" but she is fully oriented when asked questions today. Pt reports that she would like to restart medications and see a psychiatrist outpatient.   HPI:  Sarah Walton is an 41 y.o. female, seen, chart reviewed, case discussed with staff RN and Dr. Effie ShyWentz, EDPFor this face to face psychiatric evaluation and consultation of schizophrenia undifferentiated, noncompliant with medication management for several months and unable to keep in touch with the world and reality. Patient becomes nonfunctioning and unable to w care for herself. Patient was found with bizarre behaviors, wandering behaviors, responding to the internal stimuli and auditory/visual hallucinations. [facility] by family members,  then [facility] and stabilization of physical illness and also emotionally deteriorated.  Reportedly she has been diagnosed with a schizophrenia, undifferentiated and was required [facility]. Reportedly patient mother was passed away in 2017 August and reportedly she was suffered with schizophrenia too. Patient is currently not taking psychiatric medications, she could not tell as how long she's been noncompliant with medication.   As of today on 01/28/16, pt has been in the ED for 4 days. During this time, she has improved in terms of her orientation, anxiety, agitation, and psychosis. See evaluation above.    Past Psychiatric History: Schizophrenia, undifferentiated and noncompliant with medication management. Patient was treated with Abilify 30 mg daily and also monthly Abilify maintaina injections .  Risk to Self: Suicidal Ideation: No (Unable to assess. Pt refuses to answer questions.) Suicidal Intent:  (Unable to assess. Pt refuses to answer questions.) Is patient at risk for suicide?: No (Unable to assess. Pt refuses to answer questions.) Suicidal Plan?:  (Unable to assess. Pt refuses to answer questions.) Access to Means:  (Unable to assess. Pt refuses to answer questions.) What has been your use of drugs/alcohol within the last 12 months?: Unable to assess. Pt refuses to answer questions. How many times?: 0 (Unable to assess. Pt refuses to answer questions.) Other Self Harm Risks: Unable to assess. Pt refuses to answer questions. Triggers for Past Attempts: Other (Comment) (Unable to assess. Pt refuses to answer questions.) Intentional Self Injurious Behavior:  (Unable to assess. Pt refuses to answer questions.) Risk to Others: Homicidal Ideation:  (Unable to assess. Pt refuses to answer questions.) Thoughts of Harm to Others:  (Unable to assess. Pt refuses to answer questions.) Current Homicidal Intent:  (Unable to assess. Pt refuses to answer questions.) Current Homicidal Plan:  (Unable to  assess. Pt  refuses to answer questions.) Access to Homicidal Means:  (Unable to assess. Pt refuses to answer questions.) Identified Victim: Unable to assess. Pt refuses to answer questions. History of harm to others?:  (Unable to assess. Pt refuses to answer questions.) Assessment of Violence:  (Unable to assess. Pt refuses to answer questions.) Violent Behavior Description: Unable to assess. Pt refuses to answer questions. Does patient have access to weapons?: No Criminal Charges Pending?: No Does patient have a court date: No Prior Inpatient Therapy: Prior Inpatient Therapy: Yes Prior Therapy Dates: 05/2015 Prior Therapy Facilty/Provider(s): [facility] Reason for Treatment: Schizophrenia Prior Outpatient Therapy: Prior Outpatient Therapy: Yes Prior Therapy Dates: Current Prior Therapy Facilty/Provider(s): [facility] Reason for Treatment: Schizophrenia Does patient have an ACCT team?: No Does patient have Intensive In-House Services?  : No Does patient have [facility] services? : Yes Does patient have P4CC services?: No  Past Medical History:  Past Medical History:  Diagnosis Date  . Anxiety   . History of migraine headaches    History reviewed. No pertinent surgical history. Family History:  Family History  Problem Relation Age of Onset  . Anemia Mother    Family Psychiatric  History: Patient mother was suffered with schizophrenia and died recently in September 03, 2017as per chart. Social History:  History  Alcohol Use No     History  Drug Use No    Social History   Social History  . Marital status: Single    Spouse name: N/A  . Number of children: N/A  . Years of education: N/A   Social History Main Topics  . Smoking status: Never Smoker  . Smokeless tobacco: Never Used  . Alcohol use No  . Drug use: No  . Sexual activity: Not Asked   Other Topics Concern  . None   Social History Narrative  . None   Additional Social History:    Allergies:   Allergies  Allergen Reactions  .  Penicillins Other (See Comments)    Childhood Has patient had a PCN reaction causing immediate rash, facial/tongue/throat swelling, SOB or lightheadedness with hypotension: n/a Has patient had a PCN reaction causing severe rash involving mucus membranes or skin necrosis: n/a Has patient had a PCN reaction that required hospitalization: n/a Has patient had a PCN reaction occurring within the last 10 years: n/a If all of the above answers are "NO", then may proceed with Cephalosporin use.     Labs:  No results found for this or any previous visit (from the past 48 hour(s)).  Current Facility-Administered Medications  Medication Dose Route Frequency Provider Last Rate Last Dose  . acetaminophen (TYLENOL) tablet 650 mg  650 mg Oral Q4H PRN Antony Madura, PA-C   650 mg at 01/25/16 1520  . ARIPiprazole (ABILIFY) tablet 15 mg  15 mg Oral QHS Shaune Pollack, MD   15 mg at 01/27/16 2221  . ibuprofen (ADVIL,MOTRIN) tablet 600 mg  600 mg Oral Q8H PRN Linwood Dibbles, MD      . LORazepam (ATIVAN) tablet 1 mg  1 mg Oral Q8H PRN Linwood Dibbles, MD   1 mg at 01/24/16 0123  . metoCLOPramide (REGLAN) tablet 10 mg  10 mg Oral Once Shaune Pollack, MD      . OLANZapine [facility]) injection 5 mg  5 mg Intramuscular BID BM PRN Leata Mouse, MD      . ondansetron (ZOFRAN-ODT) disintegrating tablet 4 mg  4 mg Oral Q4H PRN Arby Barrette, MD   4 mg at 01/24/16 2326  .  oseltamivir (TAMIFLU) capsule 75 mg  75 mg Oral BID Mancel Bale, MD   75 mg at 01/27/16 2221   Current Outpatient Prescriptions  Medication Sig Dispense Refill  . ARIPiprazole (ABILIFY) 30 MG tablet Take 1 tablet (30 mg total) by mouth daily. (Patient not taking: Reported on 01/23/2016) 30 tablet 0  . ARIPiprazole 400 MG SUSR Inject 400 mg into the muscle every 30 (thirty) days. (Patient not taking: Reported on 01/23/2016) 1 each 0    Musculoskeletal: Strength & Muscle Tone: decreased Gait & Station: unable to stand Patient leans: N/A  Psychiatric  Specialty Exam: Physical Exam Full physical performed in Emergency Department. I have reviewed this assessment and concur with its findings.   Review of Systems  Psychiatric/Behavioral: Positive for depression. Negative for hallucinations and suicidal ideas. The patient has insomnia. The patient is not nervous/anxious.   All other systems reviewed and are negative.  complaining about generalized weakness tired, fatigued and multiple flu symptoms. Patient denied nausea, vomiting, abdomen pain shortness of breath and chest.  No Fever-chills, No Headache, No changes with Vision or hearing, reports vertigo No problems swallowing food or Liquids, No Chest pain, Cough or Shortness of Breath, No Abdominal pain, No Nausea or Vommitting, Bowel movements are regular, No Blood in stool or Urine, No dysuria, No new skin rashes or bruises, No new joints pains-aches,  No new weakness, tingling, numbness in any extremity, No recent weight gain or loss, No polyuria, polydypsia or polyphagia,   A full 10 point Review of Systems was done, except as stated above, all other Review of Systems were negative.  Blood pressure 123/76, pulse 94, temperature 98.7 F (37.1 C), temperature source Oral, resp. rate 18, SpO2 99 %.There is no height or weight on file to calculate BMI.  General Appearance: Casual and Fairly Groomed  Eye Contact:  Good  Speech:  Clear and Coherent and Normal Rate  Volume:  Normal  Mood:  Depressed  Affect:  Depressed  Thought Process:  Coherent and Goal Directed  Orientation:  Full (Time, Place, and Person)  Thought Content:  Focused on medication management  Suicidal Thoughts:  No  Homicidal Thoughts:  No  Memory:  Immediate;   Fair Recent;   Fair Remote;   Poor  Judgement:  Fair  Insight:  Fair  Psychomotor Activity:  Normal  Concentration:  Concentration: Good and Attention Span: Good  Recall:  Poor  Fund of Knowledge:  Fair  Language:  Fair  Akathisia:  No  Handed:   Right  AIMS (if indicated):     Assets:  Communication Skills Desire for Improvement Financial Resources/Insurance Housing Leisure Time Resilience Social Support  ADL's:  Impaired  Cognition:  WNL  Sleep:        Treatment Plan Summary: 41 years old female with a history of schizophrenia admitted to [facility] emergency department with flu symptoms and positive for influenza. Patient has been noncompliant with the psychiatric medication and become unstable psychiatrically which required inpatient psychiatric hospitalization for crisis stabilization. She is not appropriate for psychiatric floor at this time and recommended treatment for influenza and also started psychiatric medication Abilify 15 mg at bedtime and follow-up with her regularly and reevaluate for possible psychiatric admission  Medication management: -Continue Abilify 15 mg at bedtime daily  -Continue Zyprexa 5 mg IM/PO twice daily PRN for agitation and aggressive behavior or noncompliant with oral medications  Will contact patient family regarding collateral information and also [facility] behavioral regarding possibility of long-term injectable antipsychotic medication  like Abilify maintaina  Daily contact with patient to assess and evaluate symptoms and progress in treatment and Medication management    Disposition: -Pt may be discharged with prescriptions and outpatient resources for psychiatry and counseling. Please give RX for Abilify 15mg  po qhs  Beau Fanny, [place] 01/28/2016 10:04 AM  Agree with NP consult  note

## 2016-01-28 NOTE — ED Notes (Signed)
Patient was given a snack and drink, A regular diet ws taken for lunch.

## 2016-01-28 NOTE — ED Notes (Signed)
Taxi voucher ordered through social work

## 2016-01-28 NOTE — ED Notes (Signed)
Waiting to see if magistrates office received the D/C of the IVC papers

## 2016-01-28 NOTE — ED Notes (Signed)
Patient eating breakfast. °

## 2016-01-28 NOTE — ED Notes (Signed)
Patient has not been eating well today. Didn't touch lunch and barely ate breakfast

## 2016-01-28 NOTE — ED Notes (Signed)
Regular diet ordered for patient. 

## 2016-07-28 ENCOUNTER — Emergency Department (HOSPITAL_COMMUNITY)
Admission: EM | Admit: 2016-07-28 | Discharge: 2016-07-28 | Disposition: A | Discharge status: Other Acute Inpatient Hospital | Payer: BLUE CROSS/BLUE SHIELD | Attending: Emergency Medicine | Admitting: Emergency Medicine

## 2016-07-28 ENCOUNTER — Inpatient Hospital Stay (HOSPITAL_COMMUNITY)
Admission: AD | Admit: 2016-07-28 | Discharge: 2016-08-02 | DRG: 885 | Disposition: A | Discharge status: Home / Self Care | Payer: Federal, State, Local not specified - Other | Attending: Psychiatry | Admitting: Psychiatry

## 2016-07-28 ENCOUNTER — Encounter (HOSPITAL_COMMUNITY): Payer: Self-pay | Admitting: *Deleted

## 2016-07-28 ENCOUNTER — Encounter (HOSPITAL_COMMUNITY): Payer: Self-pay | Admitting: Emergency Medicine

## 2016-07-28 DIAGNOSIS — Z79899 Other long term (current) drug therapy: Secondary | ICD-10-CM

## 2016-07-28 DIAGNOSIS — Z818 Family history of other mental and behavioral disorders: Secondary | ICD-10-CM

## 2016-07-28 DIAGNOSIS — Z832 Family history of diseases of the blood and blood-forming organs and certain disorders involving the immune mechanism: Secondary | ICD-10-CM | POA: Diagnosis not present

## 2016-07-28 DIAGNOSIS — Z880 Allergy status to penicillin: Secondary | ICD-10-CM | POA: Diagnosis not present

## 2016-07-28 DIAGNOSIS — F209 Schizophrenia, unspecified: Secondary | ICD-10-CM | POA: Insufficient documentation

## 2016-07-28 DIAGNOSIS — F29 Unspecified psychosis not due to a substance or known physiological condition: Secondary | ICD-10-CM

## 2016-07-28 DIAGNOSIS — F250 Schizoaffective disorder, bipolar type: Principal | ICD-10-CM | POA: Diagnosis present

## 2016-07-28 DIAGNOSIS — G4700 Insomnia, unspecified: Secondary | ICD-10-CM | POA: Diagnosis present

## 2016-07-28 DIAGNOSIS — R443 Hallucinations, unspecified: Secondary | ICD-10-CM | POA: Diagnosis not present

## 2016-07-28 DIAGNOSIS — F39 Unspecified mood [affective] disorder: Secondary | ICD-10-CM | POA: Diagnosis not present

## 2016-07-28 DIAGNOSIS — F203 Undifferentiated schizophrenia: Secondary | ICD-10-CM | POA: Diagnosis not present

## 2016-07-28 LAB — CBC WITH DIFFERENTIAL/PLATELET
Basophils Absolute: 0 10*3/uL (ref 0.0–0.1)
Basophils Relative: 1 %
Eosinophils Absolute: 0 10*3/uL (ref 0.0–0.7)
Eosinophils Relative: 0 %
HEMATOCRIT: 36.1 % (ref 36.0–46.0)
HEMOGLOBIN: 12.3 g/dL (ref 12.0–15.0)
LYMPHS ABS: 2.2 10*3/uL (ref 0.7–4.0)
LYMPHS PCT: 30 %
MCH: 27.4 pg (ref 26.0–34.0)
MCHC: 34.1 g/dL (ref 30.0–36.0)
MCV: 80.4 fL (ref 78.0–100.0)
MONOS PCT: 12 %
Monocytes Absolute: 0.9 10*3/uL (ref 0.1–1.0)
NEUTROS ABS: 4.2 10*3/uL (ref 1.7–7.7)
NEUTROS PCT: 57 %
Platelets: 298 10*3/uL (ref 150–400)
RBC: 4.49 MIL/uL (ref 3.87–5.11)
RDW: 13.7 % (ref 11.5–15.5)
WBC: 7.3 10*3/uL (ref 4.0–10.5)

## 2016-07-28 LAB — COMPREHENSIVE METABOLIC PANEL
ALT: 13 U/L — AB (ref 14–54)
ANION GAP: 11 (ref 5–15)
AST: 26 U/L (ref 15–41)
Albumin: 4.3 g/dL (ref 3.5–5.0)
Alkaline Phosphatase: 67 U/L (ref 38–126)
BUN: 12 mg/dL (ref 6–20)
CHLORIDE: 100 mmol/L — AB (ref 101–111)
CO2: 21 mmol/L — AB (ref 22–32)
Calcium: 9.3 mg/dL (ref 8.9–10.3)
Creatinine, Ser: 0.91 mg/dL (ref 0.44–1.00)
GFR calc non Af Amer: >60 mL/min (ref >60)
Glucose, Bld: 79 mg/dL (ref 65–99)
POTASSIUM: 3.5 mmol/L (ref 3.5–5.1)
Sodium: 132 mmol/L — ABNORMAL LOW (ref 135–145)
Total Bilirubin: 0.9 mg/dL (ref 0.3–1.2)
Total Protein: 8.1 g/dL (ref 6.5–8.1)

## 2016-07-28 LAB — ETHANOL

## 2016-07-28 LAB — RAPID URINE DRUG SCREEN, HOSP PERFORMED
AMPHETAMINES: NOT DETECTED
BENZODIAZEPINES: NOT DETECTED
Barbiturates: NOT DETECTED
Cocaine: NOT DETECTED
Opiates: NOT DETECTED
TETRAHYDROCANNABINOL: NOT DETECTED

## 2016-07-28 LAB — PREGNANCY, URINE: Preg Test, Ur: NEGATIVE

## 2016-07-28 MED ORDER — IBUPROFEN 600 MG PO TABS: 600.0000 mg | ORAL_TABLET | Freq: Three times a day (TID) | ORAL | Status: DC | PRN

## 2016-07-28 MED ORDER — HYDROXYZINE HCL 25 MG PO TABS
25.0000 mg | ORAL_TABLET | Freq: Three times a day (TID) | ORAL | Status: DC | PRN
  Administered 2016-07-28 – 2016-07-31 (×4): 25 mg via ORAL
  Filled 2016-07-28: qty 10
  Filled 2016-07-28: qty 1
  Filled 2016-07-28: qty 10
  Filled 2016-07-28 (×3): qty 1

## 2016-07-28 MED ORDER — ACETAMINOPHEN 325 MG PO TABS: 650.0000 mg | ORAL_TABLET | Freq: Four times a day (QID) | ORAL | Status: DC | PRN

## 2016-07-28 MED ORDER — ALUM & MAG HYDROXIDE-SIMETH 200-200-20 MG/5ML PO SUSP: 30.0000 mL | Freq: Four times a day (QID) | ORAL | Status: DC | PRN

## 2016-07-28 MED ORDER — TRAZODONE HCL 50 MG PO TABS
50.0000 mg | ORAL_TABLET | Freq: Every evening | ORAL | Status: DC | PRN
  Administered 2016-07-28 (×2): 50 mg via ORAL
  Filled 2016-07-28 (×2): qty 1

## 2016-07-28 MED ORDER — ALUM & MAG HYDROXIDE-SIMETH 200-200-20 MG/5ML PO SUSP: 30.0000 mL | ORAL | Status: DC | PRN

## 2016-07-28 MED ORDER — ARIPIPRAZOLE 15 MG PO TABS
15.0000 mg | ORAL_TABLET | Freq: Every day | ORAL | Status: DC
  Administered 2016-07-29: 15 mg via ORAL
  Filled 2016-07-28 (×3): qty 1

## 2016-07-28 MED ORDER — MAGNESIUM HYDROXIDE 400 MG/5ML PO SUSP: 30.0000 mL | Freq: Every day | ORAL | Status: DC | PRN

## 2016-07-28 MED ORDER — ARIPIPRAZOLE 10 MG PO TABS
15.0000 mg | ORAL_TABLET | Freq: Every day | ORAL | Status: DC
  Administered 2016-07-28: 15 mg via ORAL
  Filled 2016-07-28: qty 2

## 2016-07-28 MED ORDER — IBUPROFEN 400 MG PO TABS: 600.0000 mg | ORAL_TABLET | Freq: Three times a day (TID) | ORAL | Status: DC | PRN

## 2016-07-28 MED ORDER — ZOLPIDEM TARTRATE 5 MG PO TABS: 5.0000 mg | ORAL_TABLET | Freq: Every evening | ORAL | Status: DC | PRN

## 2016-07-28 MED ORDER — ONDANSETRON HCL 4 MG PO TABS: 4.0000 mg | ORAL_TABLET | Freq: Three times a day (TID) | ORAL | Status: DC | PRN

## 2016-07-28 NOTE — Progress Notes (Signed)
Adult Psychoeducational Group Note  Date:  07/28/2016 Time:  9:33 PM  Group Topic/Focus:  Wrap-Up Group:   The focus of this group is to help patients review their daily goal of treatment and discuss progress on daily workbooks.  Participation Level:  Minimal  Participation Quality:  Sharing  Affect:  Flat  Cognitive:  Lacking  Insight: Lacking  Engagement in Group:  Engaged  Modes of Intervention:  Socialization and Support  Additional Comments:  Patient attended and participated in group tonight. She reports having a good day. She stated that she meet with her spirit fox, her spirit wolf. This writer had to redirect her several time. She appears to be confused.  Lita MainsFrancis, Elnor Renovato [facility] 07/28/2016, 9:33 PM

## 2016-07-28 NOTE — BH Assessment (Signed)
Tele Assessment Note   Sarah Walton is an 41 y.o. female who came to the ED by [employer] who were called because pt was found responding to internal stimuli. Pt has a history of schizophrenia and inpatient admissions due to psychosis. Pt has not been taking her medications, sleeping or eating in an unknown amount of time. Pt was guarded and suspicious during assessment and appeared to be talking to someone that wasn't there. She stated "I know I looked in her eyes to"- while looking away from the camera. She would not state who she was talking to but she did state that she saw "spiritual figurines of black jesus wearing a red cloth and looking like he was leading someone". Pt states that "God is funny, God will have his way" and laughing innapropriately. Pt states that her mom passed away in august 2017 and she is the one that "had authority over her medications". Pt states that [facility] but did not receive any medication. Pt seems to be a poor historian right now and is not able to accurately relay information. Pt denies SI, HI or substance abuse. BAL is negative and UDS is pending. No history of substance abuse in chart review.   Disposition: Inpatient recommended per Sarah Rheinina Owokwno NP. [facility] reviewing  Diagnosis: Schizophrenia   Past Medical History:  Past Medical History:  Diagnosis Date  . Anxiety   . History of migraine headaches     History reviewed. No pertinent surgical history.  Family History:  Family History  Problem Relation Age of Onset  . Anemia Mother     Social History:  reports that she has never smoked. She has never used smokeless tobacco. She reports that she does not drink alcohol or use drugs.  Additional Social History:  Alcohol / Drug Use Pain Medications: See MAR Prescriptions: See MAR Over the Counter: See MAR History of alcohol / drug use?: No history of alcohol / drug abuse Longest period of sobriety (when/how long): NA  CIWA: CIWA-Ar BP:  (!) 134/95 Pulse Rate: 100 COWS:    PATIENT STRENGTHS: (choose at least two) Average or above average intelligence Supportive family/friends  Allergies:  Allergies  Allergen Reactions  . Penicillins Other (See Comments)    Childhood Has patient had a PCN reaction causing immediate rash, facial/tongue/throat swelling, SOB or lightheadedness with hypotension: n/a Has patient had a PCN reaction causing severe rash involving mucus membranes or skin necrosis: n/a Has patient had a PCN reaction that required hospitalization: n/a Has patient had a PCN reaction occurring within the last 10 years: n/a If all of the above answers are "NO", then may proceed with Cephalosporin use.     Home Medications:  (Not in a hospital admission)  OB/GYN Status:  No LMP recorded (lmp unknown).  General Assessment Data [facility] Assessment: In system Is this a Tele or Face-to-Face Assessment?: Tele Assessment Is this an Initial Assessment or a Re-assessment for this encounter?: Initial Assessment Marital status: Single Is patient pregnant?: No Pregnancy Status: No Living Arrangements: Non-relatives/Friends Can pt return to current living arrangement?: Yes Admission Status: Voluntary Is patient capable of signing voluntary admission?: Yes Referral Source:  ([employer]) Insurance type: BCBS     Crisis Care Plan Living Arrangements: Non-relatives/Friends [facility]: [facility]  Education Status Is patient currently in school?: No Highest grade of school patient has completed: UTA  Risk to self with the past 6 months  Suicidal Ideation: No Has patient been a risk to self within the past 6 months prior to admission? : No Suicidal Intent: No Has patient had any suicidal intent within the past 6 months prior to admission? : No Is patient at risk for suicide?: No Suicidal Plan?: No Has patient had any suicidal plan within the past 6 months  prior to admission? : No Access to Means: No What has been your use of drugs/alcohol within the last 12 months?: denies use  Previous Attempts/Gestures:  (UTA) How many times?:  (unknown) Other Self Harm Risks: UTA Triggers for Past Attempts: None known Intentional Self Injurious Behavior: None Family Suicide History: No Recent stressful life event(s): Loss (Comment) Persecutory voices/beliefs?: Yes Depression: Yes Depression Symptoms: Despondent, Insomnia, Isolating Substance abuse history and/or treatment for substance abuse?: No Suicide prevention information given to non-admitted patients: Not applicable  Risk to Others within the past 6 months Homicidal Ideation: No Does patient have any lifetime risk of violence toward others beyond the six months prior to admission? : No Thoughts of Harm to Others: No Current Homicidal Intent: No Current Homicidal Plan: No Access to Homicidal Means: No Identified Victim: none History of harm to others?: No Assessment of Violence: None Noted Violent Behavior Description: none Does patient have access to weapons?: No Criminal Charges Pending?: No Does patient have a court date: No Is patient on probation?: No  Psychosis Hallucinations: Visual Delusions: Unspecified  Mental Status Report Appearance/Hygiene: Bizarre Eye Contact: Fair Motor Activity: Unremarkable Speech: Elective mutism, Tangential Level of Consciousness: Alert Mood: Suspicious Affect: Anxious Anxiety Level: Minimal Thought Processes: Irrelevant Judgement: Impaired Orientation: Person Obsessive Compulsive Thoughts/Behaviors: Unable to Assess  Cognitive Functioning Concentration: Decreased Memory: Recent Intact, Remote Intact IQ: Average Insight: Poor Impulse Control: Poor Appetite: Poor Weight Loss:  (unknown but reports weight loss) Weight Gain: 0 Sleep: Decreased Total Hours of Sleep:  (hasn't slept in over 2 weeks) Vegetative Symptoms: Not  bathing  ADLScreening [facility] Assessment Services) Patient's cognitive ability adequate to safely complete daily activities?: Yes Patient able to express need for assistance with ADLs?: Yes Independently performs ADLs?: Yes (appropriate for developmental age)  Prior Inpatient Therapy Prior Inpatient Therapy: Yes Prior Therapy Dates: ongoing Prior Therapy Facilty/Provider(s): [facility] Reason for Treatment: psychosis   Prior Outpatient Therapy Prior Outpatient Therapy: Yes Prior Therapy Dates: ongoing Prior Therapy Facilty/Provider(s): [facility] Reason for Treatment: psychosis Does patient have an ACCT team?: No Does patient have Intensive In-House Services?  : No Does patient have Sarah Walton services? : No Does patient have P4CC services?: No  ADL Screening (condition at time of admission) Patient's cognitive ability adequate to safely complete daily activities?: Yes Is the patient deaf or have difficulty hearing?: No Does the patient have difficulty seeing, even when wearing glasses/contacts?: No Does the patient have difficulty concentrating, remembering, or making decisions?: No Patient able to express need for assistance with ADLs?: Yes Does the patient have difficulty dressing or bathing?: No Independently performs ADLs?: Yes (appropriate for developmental age) Does the patient have difficulty walking or climbing stairs?: No Weakness of Legs: None Weakness of Arms/Hands: None  Home Assistive Devices/Equipment Home Assistive Devices/Equipment: None  Therapy Consults (therapy consults require a physician order) PT Evaluation Needed: No OT Evalulation Needed: No SLP Evaluation Needed: No Abuse/Neglect Assessment (Assessment to be complete while patient is alone) Physical Abuse: Denies (pt statees she "can't remember because of the generation gap") Verbal Abuse: Denies (pt statees she "can't remember because of the generation gap) Sexual Abuse: Denies (pt statees  she "can't remember  because of the generation gap) Exploitation of patient/patient's resources: Denies Self-Neglect: Denies Values / Beliefs Cultural Requests During Hospitalization: None Spiritual Requests During Hospitalization: None Consults Spiritual Care Consult Needed: No Social Work Consult Needed: No Merchant navy officer (For Healthcare) Does Patient Have a Medical Advance Directive?: No Would patient like information on creating a medical advance directive?: No - Patient declined Nutrition Screen- [facility] Adult/WL/AP Patient's home diet: Regular Has the patient recently lost weight without trying?: No Has the patient been eating poorly because of a decreased appetite?: No Malnutrition Screening Tool Score: 0  Additional Information 1:1 In Past 12 Months?: No CIRT Risk: No Elopement Risk: No Does patient have medical clearance?: Yes     Disposition:  Disposition Initial Assessment Completed for this Encounter: Yes Disposition of Patient: Inpatient treatment program Type of inpatient treatment program: Adult  Jarrett Ables 07/28/2016 12:27 PM

## 2016-07-28 NOTE — Progress Notes (Signed)
Pt accepted [facility], Bed [facility]-1,  Ferne ReusJustina Okonkwo, NP is the accepting provider Dr. Elna BreslowEappen is the attending provider Call report to [phone] Pt. May transfer at any time.  Timmothy EulerJean T. Kaylyn LimSutter, MSW, LCSWA Clinical Social Work Disposition [phone]

## 2016-07-28 NOTE — ED Notes (Signed)
Call received from magistrate, stating the pt will be IVC'd now, per aunt's request. Papers to be delivered by officer shortly

## 2016-07-28 NOTE — ED Provider Notes (Addendum)
[facility]-EMERGENCY DEPT Provider Note   CSN: [id number] Arrival date & time: 07/28/16  1046     History   Chief Complaint Chief Complaint  Patient presents with  . Psychiatric Evaluation    HPI Sarah Walton is a 41 y.o. female.  HPI   41 year old female with history of schizophrenia, anxiety, recurrent headache [facility]. History is difficult to obtain as patient wouldn't answer some question of some she would not give me an answer. She did tell me that she is not suicidal or homicidal. When asked if she is having auditory or visual hallucination she did not reply. She admits that she is not sleeping as much. She denies having any active pain. However for the majority of my questionnaires, patient would just look at me and refused to answer. It was noted that patient was responding to internal stimuli by [employer].  Level V caveats apply due to psychiatric illness  Past Medical History:  Diagnosis Date  . Anxiety   . History of migraine headaches     Patient Active Problem List   Diagnosis Date Noted  . 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 Schizophrenia (HCC) 06/11/2015  . Psychosis 06/11/2015    No past surgical history on file.  OB History    No data available       Home Medications    Prior to Admission medications   Medication Sig Start Date End Date Taking? Authorizing Provider  ARIPiprazole (ABILIFY) 15 MG tablet Take 1 tablet (15 mg total) by mouth daily. At bedtime 01/28/16   Arby BarrettePfeiffer, Marcy, MD  ARIPiprazole (ABILIFY) 30 MG tablet Take 1 tablet (30 mg total) by mouth daily. Patient not taking: Reported on 01/23/2016 06/17/15   Jimmy FootmanHernandez-Gonzalez, Andrea, MD  ARIPiprazole 400 MG SUSR Inject 400 mg into the muscle every 30 (thirty) days. Patient not taking: Reported on 01/23/2016 07/15/15   Jimmy FootmanHernandez-Gonzalez, Andrea, MD  oseltamivir (TAMIFLU) 75 MG capsule Take 1 capsule (75 mg total) by mouth every 12 (twelve) hours.  01/28/16   Arby BarrettePfeiffer, Marcy, MD    Family History Family History  Problem Relation Age of Onset  . Anemia Mother     Social History Social History  Substance Use Topics  . Smoking status: Never Smoker  . Smokeless tobacco: Never Used  . Alcohol use No     Allergies   Penicillins   Review of Systems Review of Systems  Unable to perform ROS: Psychiatric disorder     Physical Exam Updated Vital Signs There were no vitals taken for this visit.  Physical Exam  Constitutional: She appears well-developed and well-nourished. No distress.  Obese female resting comfortably in no acute discomfort.  HENT:  Head: Atraumatic.  Mouth/Throat: Oropharynx is clear and moist.  Eyes: Pupils are equal, round, and reactive to light. Conjunctivae and EOM are normal.  Neck: Neck supple.  Cardiovascular: Normal rate and regular rhythm.   Pulmonary/Chest: Effort normal and breath sounds normal.  Abdominal: Soft.  Neurological: She is alert. GCS eye subscore is 4. GCS verbal subscore is 5. GCS motor subscore is 6.  Skin: No rash noted.  Psychiatric: Her affect is labile. She is withdrawn. Thought content is delusional. She is noncommunicative.  Nursing note and vitals reviewed.    ED Treatments / Results  Labs (all labs ordered are listed, but only abnormal results are displayed) Labs Reviewed  COMPREHENSIVE METABOLIC PANEL - Abnormal; Notable for the following:       Result Value  Sodium 132 (*)    Chloride 100 (*)    CO2 21 (*)    ALT 13 (*)    All other components within normal limits  CBC WITH DIFFERENTIAL/PLATELET  PREGNANCY, URINE  RAPID URINE DRUG SCREEN, HOSP PERFORMED  ETHANOL    EKG  EKG Interpretation None       Radiology No results found.  Procedures Procedures (including critical care time)  Medications Ordered in ED Medications  ibuprofen (ADVIL,MOTRIN) tablet 600 mg (not administered)  zolpidem (AMBIEN) tablet 5 mg (not administered)  ondansetron  (ZOFRAN) tablet 4 mg (not administered)  alum & mag hydroxide-simeth (MAALOX/MYLANTA) 200-200-20 MG/5ML suspension 30 mL (not administered)  ARIPiprazole (ABILIFY) tablet 15 mg (15 mg Oral Given 07/28/16 1210)     Initial Impression / Assessment and Plan / ED Course  I have reviewed the triage vital signs and the nursing notes.  Pertinent labs & imaging results that were available during my care of the patient were reviewed by me and considered in my medical decision making (see chart for details).     BP (!) 134/95 (BP Location: Right Arm)   Pulse 100   Temp 99 F (37.2 C) (Oral)   Resp 18   LMP  (LMP Unknown)   SpO2 99%    Final Clinical Impressions(s) / ED Diagnoses   Final diagnoses:  Psychosis, unspecified psychosis type    New Prescriptions New Prescriptions   No medications on file   11:11 AM Patient with history of schizophrenia, psychosis, brought here for symptoms suggestive of psychosis. Initially patient is taking her medication. She does not appear to be in any acute discomfort. We'll perform screening labs for medical clearance. Once patient is medically cleared, she can be evaluated further by TTS.   12:19 PM Labs are reassuring. Patient is medically cleared and can be evaluated further by TTS.   Fayrene Helper, PA-C 07/28/16 1117    Jacalyn Lefevre, MD 07/28/16 1151    Fayrene Helper, PA-C 07/28/16 4098    Jacalyn Lefevre, MD 07/28/16 [phone]

## 2016-07-28 NOTE — ED Notes (Signed)
Turkey sandwich given to pt.  

## 2016-07-28 NOTE — ED Notes (Signed)
[place] [facility] made aware of need for transport

## 2016-07-28 NOTE — ED Notes (Signed)
Meal order placed

## 2016-07-28 NOTE — ED Notes (Signed)
Report

## 2016-07-28 NOTE — ED Notes (Addendum)
PT leaves facility with [employer] at this time. [facility]

## 2016-07-28 NOTE — ED Notes (Signed)
Lanora ManisElizabeth, RN accepts report. PT to go to room 507-1

## 2016-07-28 NOTE — Tx Team (Signed)
Initial Treatment Plan 07/28/2016 6:52 PM Sarah HawthorneYolanda M Walton HQI:[id number]    PATIENT STRESSORS: Medication change or noncompliance   PATIENT STRENGTHS: Average or above average intelligence Capable of independent living General fund of knowledge Supportive family/friends   PATIENT IDENTIFIED PROBLEMS: Psychosis Medication compliance  "People, they need to start looking at the situation"                   DISCHARGE CRITERIA:  Ability to meet basic life and health needs Improved stabilization in mood, thinking, and/or behavior Verbal commitment to aftercare and medication compliance  PRELIMINARY DISCHARGE PLAN: Attend aftercare/continuing care group Return to previous living arrangement  PATIENT/FAMILY INVOLVEMENT: This treatment plan has been presented to and reviewed with the patient, Sarah HawthorneYolanda M Walton, and/or family member, .  The patient and family have been given the opportunity to ask questions and make suggestions.  Anistyn Graddy, [place], [place] 07/28/2016, 6:52 PM

## 2016-07-28 NOTE — ED Triage Notes (Signed)
Pt arrives with [facility] who were called [facility]> Pt seems to be responding to unseen stimuli. Pt reports not sleeping or eating. NAD noted.

## 2016-07-28 NOTE — ED Notes (Signed)
Staffing states no sitter available until 3p

## 2016-07-28 NOTE — Progress Notes (Signed)
D: Pt denies SI/HI/AVH. Pt is very paranoid, pt appears delusional and appears to be responding but continues to deny at this time. Pt presents with disorganized thoughts, loose associations, irritable. Pt came out of her room and sat in the hall wanting to sit there, but it was explained to pt that she had her own room and she could sit in there. Pt presented to the nurses station delusional and disorganized and when asked what she was talking about pt continued to ramble incoherently.   A: Pt was offered support and encouragement. Pt was given  PRN  medications. Pt was encourage to attend groups. Q 15 minute checks were done for safety.   R: safety maintained on unit.

## 2016-07-28 NOTE — Progress Notes (Signed)
41 year old female pt admitted on involuntary basis. On admission, Sarah Walton appears paranoid and delusional, she has appeared to be responding to internal stimuli on admission and made references about her Aunt that were suspicious in nature. When asked about medications, she reported that she does not take any. She also reported that she does not drink or use any illicit drugs. Sarah Walton denies any SI on admission and is able to contract for safety while on the unit. Sarah Walton was oriented to the unit and safety maintained.

## 2016-07-29 DIAGNOSIS — F203 Undifferentiated schizophrenia: Secondary | ICD-10-CM

## 2016-07-29 MED ORDER — ARIPIPRAZOLE 5 MG PO TABS
5.0000 mg | ORAL_TABLET | Freq: Every day | ORAL | Status: DC
  Administered 2016-07-30: 5 mg via ORAL
  Filled 2016-07-29 (×2): qty 1

## 2016-07-29 MED ORDER — TRAZODONE HCL 50 MG PO TABS
50.0000 mg | ORAL_TABLET | Freq: Every evening | ORAL | Status: DC | PRN
  Administered 2016-07-29: 50 mg via ORAL
  Filled 2016-07-29 (×2): qty 1

## 2016-07-29 NOTE — Progress Notes (Signed)
After pt heard another pt say she did not want to talk to writer, but stated the same thing and for the duration of the evening would not engage with Clinical research associatewriter. Pt appears to be having an issue tolerating her roommate at night. Pt has been out her room and up the hall numerous times in the evening.

## 2016-07-29 NOTE — [facility] Counselor (Signed)
Adult Comprehensive Assessment  Patient ID: Sarah Walton, female   DOB: Dec 04, 1975, 41 y.o.   MRN: [id number]  Information Source: Information source: Patient  Current Stressors:  Educational / Learning stressors: None reported  Employment / Job issues: Pt is employed Family Relationships:  Pt is very suspicious about family members, mother died in 08/26/2017Financial / Lack of resources (include bankruptcy): Limited income.  Housing / Lack of housing:  Lives with [employer]. Physical health (include injuries & life threatening diseases): None reported  Social relationships: None reported  Substance abuse: Denies use.  Bereavement / Loss: Mother died in 09/11/2015.  Pt went through individual and group counseling at [facility] until May 2018.  Living/Environment/Situation:  Living Arrangements: Parent Living conditions (as described by patient or guardian): Pt reports she her mother lives with her.  How long has patient lived in current situation?: less than a month.  What is atmosphere in current home: Dangerous  Family History:  Marital status: Single Are you sexually active?: No What is your sexual orientation?: refused to answer  Has your sexual activity been affected by drugs, alcohol, medication, or emotional stress?: None reported  Does patient have children?: No  Childhood History:  By whom was/is the patient raised?: Mother Description of patient's relationship with caregiver when they were a child: ok relationship with mother  Patient's description of current relationship with people who raised him/her: Pt reports her mother has schizophernia. She states "her behaviors have changed. I felt it was aggressive. She takes off and wears make up."  How were you disciplined when you got in trouble as a child/adolescent?: None reported  Does patient have siblings?: No Did patient suffer any verbal/emotional/physical/sexual abuse as a child?: No ("I think everyone has"  ) Did patient suffer from severe childhood neglect?: No Has patient ever been sexually abused/assaulted/raped as an adolescent or adult?: No Was the patient ever a victim of a crime or a disaster?: No Witnessed domestic violence?: No Has patient been effected by domestic violence as an adult?: No  Education:  Highest grade of school patient has completed: Associates degree  Currently a student?: No Learning disability?: No  Employment/Work Situation:   Employment situation: Employed (not sure whether it is part-time or full-time, states she is tired) Where is patient currently employed?: Engineer, site  How long has patient been employed?: "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  Then went to work for [employer]. Patient's job has been impacted by current illness: No What is the longest time patient has a held a job?: 7 years  Where was the patient employed at that time?: [employer].  Has patient ever been in the [employer]?: No  Financial Resources:   Surveyor, quantity resources: Income from employment Does patient have a representative payee or guardian?: Yes Name of representative payee or guardian: Pt reports she had a guardian in the past.   Alcohol/Substance Abuse:   What has been your use of drugs/alcohol within the last 12 months?: denies use.  Alcohol/Substance Abuse Treatment Hx: Denies past history Has alcohol/substance abuse ever caused legal problems?: No  Social Support System:   Patient's Community Support System: Fair Describe Community Support System: Aunts Type of faith/religion: Baptist  How does patient's faith help to cope with current illness?: prayer, [employer] church  Leisure/Recreation:   Leisure and Hobbies: reading, tv and radio   Strengths/Needs:   What things  does the patient do well?: taking care of patients  In what areas does patient struggle / problems for  patient:  "Change and development," grief   Discharge Plan:   Does patient have access to transportation?: Yes, aunt Will patient be returning to same living situation after discharge?: Yes, will probably return to live with Charletta CousinAunt Katherine, but has to think about it Currently receiving community mental health services: Yes, had a Comprehensive Clinical Assessment with [facility] on Thursday 07/27/16, was scheduled to see a doctor, only needs to go back to walk-in clinic to see doctor to establish services If no, would patient like referral for services when discharged?: Yes (What county?) Medical sales representative([place] ) Does patient have financial barriers related to discharge medications?: Yes Patient description of barriers related to discharge medications: No income, limited insurance  Summary/Recommendations:  Patient is a 41yo female readmitted under IVC responding to internal stimuli, with a history of schizophrenia.  She has not been taking her medications, sleeping or eating in an unknown period of time.  Primary stressors include her mother's death in August 2017.  She lives with her Charletta Cousinunt Katherine currently, reports that she works as a Engineer, sitemedical assistant. Patient will benefit from crisis stabilization, medication evaluation, group therapy and psychoeducation, in addition to case management for discharge planning. At discharge it is recommended that Patient adhere to the established discharge plan and continue in treatment.  Ambrose MantleMareida Grossman-Orr, LCSW 07/29/2016, 11:53 AM

## 2016-07-29 NOTE — Progress Notes (Signed)
Patient ID: Burnard HawthorneYolanda M Pennypacker, female   DOB: 28-Mar-1975, 41 y.o.   MRN: [id number]    D: Pt started off the morning very labile, she got upset and started cussing at all the female patients in the dayroom. She started demanding that they all leave the room, staff intervene patient would not responding to staffs directive. Pt eventually left the dayroom and went down to her room. Pt took her morning medications after incident, about 1 hour later she almost passed out when she stood up. Staff assisted patient down to her room, checked her vitals her BP was low. Dr. Jama Flavorsobos was made aware of situation, changes made to patients medication regimen. Pt reported being negative SI/HI, no AH/VH noted. A: 15 min checks continued for patient safety. R: Pt safety maintained.

## 2016-07-29 NOTE — [facility] Group Notes (Signed)
[facility] Group Notes: (Clinical Social Work)   07/29/2016      Type of Therapy:  Group Therapy   Participation Level:  Did Not Attend  - was in the room when group was starting, but expressed feeling very nauseous, was encouraged to go lay down in her room.   Sarah MantleMareida Grossman-Orr, LCSW 07/29/2016, 1:02 PM

## 2016-07-29 NOTE — Plan of Care (Signed)
Problem: Safety: Goal: Periods of time without injury will increase Outcome: Progressing Pt safe on the unit at this time   

## 2016-07-29 NOTE — H&P (Addendum)
Psychiatric Admission Assessment Adult  Patient Identification: Sarah Walton MRN:  [id number] Date of Evaluation:  07/29/2016 Chief Complaint:  " I had a disagreement with my aunt "  Principal Diagnosis: Schizophrenia by history. Diagnosis:   Patient Active Problem List   Diagnosis Date Noted  . Schizoaffective disorder, bipolar type ([place]) [F25.0] 07/28/2016  . 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 Schizophrenia ([place]) [F20.9] 06/11/2015  . Psychosis [F29] 06/11/2015   History of Present Illness: 41 year old female, lives with an aunt. Presents as a limited historian. States that on day of admission she had an altercation with aunt and " so I left the house and just started walking ". States " I was just walking , decided to help pick up some trash, help make the neighborhood cleaner". States " the cops just showed up and brought me to the hospital". As per chart notes, [employer] had been called because patient appears to be responding to internal stimuli. She presents with thought disorder, and states " I don't know why all this is happening , it's a rapture of love that is going on". " I can feel a change of life happening" " I am like a blood thinner for my family". " you know wolves, well my spirit animal is a fox ". She denies hallucinations. Denies depression, but endorses poor sleep, poor appetite,. States concentration, energy level normal. Denies having any suicide ideations.  She has a history of chronic mental illness, has been diagnosed with Schizophrenia in the past.  Denies drug or alcohol abuse, and admission UDS is negative, BAL < 5  States she has been off her prescribed psychiatric medications for months.   Associated Signs/Symptoms: Depression Symptoms:  insomnia, decreased appetite,  Denies anhedonia, denies changes in energy level, denies suicidal ideations (Hypo) Manic Symptoms:  Does not endorse  Anxiety Symptoms: denies anxiety, denies social  anxiety, denies panic, denies agoraphobia Psychotic Symptoms:denies hallucinations, no delusions expressed at this time, disorganized behaviors  PTSD Symptoms: Denies  Total Time spent with patient: 45 minutes  Past Psychiatric History: history of 2  prior psychiatric admissions , 2011 and May 2017. That admission was for similar circumstances than above ( found wandering , disorganized, psychotic). Denies history of self cutting or of suicide attempts. Denies history of violence .  Is the patient at risk to self? Yes.   - denies suicidal ideations but presents with disorganized thought process /behaviors that can jeopardize her safety, affect her ADLs. Has the patient been a risk to self in the past 6 months? Yes.    Has the patient been a risk to self within the distant past? Yes.    Is the patient a risk to others? No.  Has the patient been a risk to others in the past 6 months? No.  Has the patient been a risk to others within the distant past? No.   Prior Inpatient Therapy:  as above  Prior Outpatient Therapy:  no recent psychiatric follow ups , currently not receiving any outpatient mental health services .  Alcohol Screening: 1. How often do you have a drink containing alcohol?: Never 9. Have you or someone else been injured as a result of your drinking?: No 10. Has a relative or friend or a doctor or another health worker been concerned about your drinking or suggested you cut down?: No Alcohol Use Disorder Identification Test Final Score (AUDIT): 0 Brief Intervention: AUDIT score less than 7 or less-screening does not suggest unhealthy drinking-brief  intervention not indicated Substance Abuse History in the last 12 months:  No. denies cannabis , other drug, or alcohol abuse  Consequences of Substance Abuse: Denies  Previous Psychotropic Medications: after her most recent psychiatric admission ( [place], ( 06/2015)) had been prescribed Abilify 30 mgrs QDAY PO and had been started  on Abilify Susr (Depot IM )at 400 mgrs. States she did not continue with medications after discharge . Psychological Evaluations: No  Past Medical History: denies medical illnesses  Past Medical History:  Diagnosis Date  . Anxiety   . History of migraine headaches    History reviewed. No pertinent surgical history. Family History: mother passed away last year , from PE, states she never met with her father, has not contact with him. Has 2 sisters  , 1 brother.  Family History  Problem Relation Age of Onset  . Anemia Mother    Family Psychiatric  History: states mother has bee diagnosed with Schizophrenia , denies suicides in family , denies drug or alcohol abuse in family Tobacco Screening: Have you used any form of tobacco in the last 30 days? (Cigarettes, Smokeless Tobacco, Cigars, and/or Pipes): No Social History: single, has no children, lives with an aunt, works as a Building control surveyor, denies legal issues  History  Alcohol Use No     History  Drug Use No    Additional Social History:   Allergies:   Allergies  Allergen Reactions  . Penicillins Other (See Comments)    Childhood Has patient had a PCN reaction causing immediate rash, facial/tongue/throat swelling, SOB or lightheadedness with hypotension: n/a Has patient had a PCN reaction causing severe rash involving mucus membranes or skin necrosis: n/a Has patient had a PCN reaction that required hospitalization: n/a Has patient had a PCN reaction occurring within the last 10 years: n/a If all of the above answers are "NO", then may proceed with Cephalosporin use.    Lab Results:  Results for orders placed or performed during the hospital encounter of 07/28/16 (from the past 48 hour(s))  Comprehensive metabolic panel     Status: Abnormal   Collection Time: 07/28/16 10:59 AM  Result Value Ref Range   Sodium 132 (L) 135 - 145 mmol/L   Potassium 3.5 3.5 - 5.1 mmol/L   Chloride 100 (L) 101 - 111 mmol/L   CO2 21 (L) 22 - 32 mmol/L    Glucose, Bld 79 65 - 99 mg/dL   BUN 12 6 - 20 mg/dL   Creatinine, Ser 0.91 0.44 - 1.00 mg/dL   Calcium 9.3 8.9 - 10.3 mg/dL   Total Protein 8.1 6.5 - 8.1 g/dL   Albumin 4.3 3.5 - 5.0 g/dL   AST 26 15 - 41 U/L   ALT 13 (L) 14 - 54 U/L   Alkaline Phosphatase 67 38 - 126 U/L   Total Bilirubin 0.9 0.3 - 1.2 mg/dL   GFR calc non Af Amer >60 >60 mL/min   GFR calc Af Amer >60 >60 mL/min    Comment: (NOTE) The eGFR has been calculated using the CKD EPI equation. This calculation has not been validated in all clinical situations. eGFR's persistently <60 mL/min signify possible Chronic Kidney Disease.    Anion gap 11 5 - 15  CBC with Differential     Status: None   Collection Time: 07/28/16 10:59 AM  Result Value Ref Range   WBC 7.3 4.0 - 10.5 K/uL   RBC 4.49 3.87 - 5.11 MIL/uL   Hemoglobin 12.3 12.0 - 15.0 g/dL  HCT 36.1 36.0 - 46.0 %   MCV 80.4 78.0 - 100.0 fL   MCH 27.4 26.0 - 34.0 pg   MCHC 34.1 30.0 - 36.0 g/dL   RDW 13.7 11.5 - 15.5 %   Platelets 298 150 - 400 K/uL   Neutrophils Relative % 57 %   Neutro Abs 4.2 1.7 - 7.7 K/uL   Lymphocytes Relative 30 %   Lymphs Abs 2.2 0.7 - 4.0 K/uL   Monocytes Relative 12 %   Monocytes Absolute 0.9 0.1 - 1.0 K/uL   Eosinophils Relative 0 %   Eosinophils Absolute 0.0 0.0 - 0.7 K/uL   Basophils Relative 1 %   Basophils Absolute 0.0 0.0 - 0.1 K/uL  Ethanol     Status: None   Collection Time: 07/28/16 10:59 AM  Result Value Ref Range   Alcohol, Ethyl (B) <5 <5 mg/dL    Comment:        LOWEST DETECTABLE LIMIT FOR SERUM ALCOHOL IS 5 mg/dL FOR MEDICAL PURPOSES ONLY   Pregnancy, urine     Status: None   Collection Time: 07/28/16 11:15 AM  Result Value Ref Range   Preg Test, Ur NEGATIVE NEGATIVE    Comment:        THE SENSITIVITY OF THIS METHODOLOGY IS >20 mIU/mL.   Urine rapid drug screen (hosp performed)     Status: None   Collection Time: 07/28/16 11:15 AM  Result Value Ref Range   Opiates NONE DETECTED NONE DETECTED    Cocaine NONE DETECTED NONE DETECTED   Benzodiazepines NONE DETECTED NONE DETECTED   Amphetamines NONE DETECTED NONE DETECTED   Tetrahydrocannabinol NONE DETECTED NONE DETECTED   Barbiturates NONE DETECTED NONE DETECTED    Comment:        DRUG SCREEN FOR MEDICAL PURPOSES ONLY.  IF CONFIRMATION IS NEEDED FOR ANY PURPOSE, NOTIFY LAB WITHIN 5 DAYS.        LOWEST DETECTABLE LIMITS FOR URINE DRUG SCREEN Drug Class       Cutoff (ng/mL) Amphetamine      1000 Barbiturate      200 Benzodiazepine   384 Tricyclics       536 Opiates          300 Cocaine          300 THC              50     Blood Alcohol level:  Lab Results  Component Value Date   ETH <5 07/28/2016   ETH <5 46/80/3212    Metabolic Disorder Labs:  Lab Results  Component Value Date   HGBA1C 5.9 06/16/2015   Lab Results  Component Value Date   PROLACTIN 62.9 (H) 06/16/2015   Lab Results  Component Value Date   CHOL 127 06/16/2015   TRIG 50 06/16/2015   HDL 34 (L) 06/16/2015   CHOLHDL 3.7 06/16/2015   VLDL 10 06/16/2015   LDLCALC 83 06/16/2015    Current Medications: Current Facility-Administered Medications  Medication Dose Route Frequency Provider Last Rate Last Dose  . acetaminophen (TYLENOL) tablet 650 mg  650 mg Oral Q6H PRN Okonkwo, Justina A, NP      . alum & mag hydroxide-simeth (MAALOX/MYLANTA) 200-200-20 MG/5ML suspension 30 mL  30 mL Oral Q4H PRN Okonkwo, Justina A, NP      . ARIPiprazole (ABILIFY) tablet 15 mg  15 mg Oral Daily Okonkwo, Justina A, NP   15 mg at 07/29/16 0826  . hydrOXYzine (ATARAX/VISTARIL) tablet 25 mg  25  mg Oral TID PRN Hughie Closs A, NP   25 mg at 07/28/16 2201  . ibuprofen (ADVIL,MOTRIN) tablet 600 mg  600 mg Oral Q8H PRN Okonkwo, Justina A, NP      . magnesium hydroxide (MILK OF MAGNESIA) suspension 30 mL  30 mL Oral Daily PRN Okonkwo, Justina A, NP      . ondansetron (ZOFRAN) tablet 4 mg  4 mg Oral Q8H PRN Okonkwo, Justina A, NP      . traZODone (DESYREL) tablet 50 mg   50 mg Oral QHS PRN,MR X 1 Okonkwo, Justina A, NP   50 mg at 07/28/16 2332  . zolpidem (AMBIEN) tablet 5 mg  5 mg Oral QHS PRN Lu Duffel, Justina A, NP       PTA Medications: Prescriptions Prior to Admission  Medication Sig Dispense Refill Last Dose  . ARIPiprazole (ABILIFY) 15 MG tablet Take 1 tablet (15 mg total) by mouth daily. At bedtime (Patient not taking: Reported on 07/28/2016) 14 tablet 0 Not Taking at Unknown time  . ARIPiprazole (ABILIFY) 30 MG tablet Take 1 tablet (30 mg total) by mouth daily. (Patient not taking: Reported on 01/23/2016) 30 tablet 0 Not Taking at Unknown time  . ARIPiprazole 400 MG SUSR Inject 400 mg into the muscle every 30 (thirty) days. (Patient not taking: Reported on 01/23/2016) 1 each 0 Not Taking at Unknown time  . oseltamivir (TAMIFLU) 75 MG capsule Take 1 capsule (75 mg total) by mouth every 12 (twelve) hours. (Patient not taking: Reported on 07/28/2016) 4 capsule 0 Not Taking at Unknown time    Musculoskeletal: Strength & Muscle Tone: within normal limits Gait & Station: normal Patient leans: N/A  Psychiatric Specialty Exam: Physical Exam  Review of Systems  Constitutional: Negative.   HENT: Negative.   Eyes: Negative.   Respiratory: Negative.   Cardiovascular: Negative.   Gastrointestinal: Positive for diarrhea and nausea. Negative for abdominal pain and vomiting.  Genitourinary: Negative.   Musculoskeletal: Negative.   Skin: Negative.   Neurological: Negative for seizures.  Endo/Heme/Allergies: Negative.   Psychiatric/Behavioral: Negative for depression and suicidal ideas.  All other systems reviewed and are negative.   Blood pressure (!) 90/57, pulse (!) 52, temperature 99 F (37.2 C), resp. rate 16, height 5' (1.524 m), weight 73.5 kg (162 lb), SpO2 97 %.Body mass index is 31.64 kg/m.  7/14 1,10 PM vitals - sitting BP 109/68, pulse 74, standing BP 80/49 pulse 106   General Appearance: Fairly Groomed, fairly related, presents drowsy  Eye  Contact:  Fair  Speech:  Slow  Volume:  Decreased  Mood:  denies feeling depressed   Affect:  blunted   Thought Process:  Disorganized and Descriptions of Associations: Tangential  Orientation:  Full (Time, Place, and Person)- presents drowsy, but alertable , and is oriented x 3   Thought Content:  denies hallucinations, does not appear internally preoccupied   Suicidal Thoughts:  No denies suicidal or self injurious ideations, no homicidal or violent ideations   Homicidal Thoughts:  No  Memory:  recent and remote fair  Judgement:  Fair  Insight:  limited  Psychomotor Activity:  Decreased  Concentration:  Concentration: Fair and Attention Span: Fair  Recall:  [employer] of Knowledge:  Fair  Language:  Fair  Akathisia:  Negative  Handed:  Right  AIMS (if indicated):     Assets:  Desire for Improvement Resilience  ADL's:  Intact  Cognition:  Drowsy, but alertable on calling her name, does fall asleep at times  during session, she is oriented x 3 .  Sleep:  Number of Hours: 2.25    Treatment Plan Summary: Daily contact with patient to assess and evaluate symptoms and progress in treatment, Medication management, Plan inpatient admission and medications as below  Observation Level/Precautions:  15 minute checks- fall precautions as reviewed with Nursing Staff   Laboratory:  as needed  TSH, Lipid Panel, Prolactin,  HgbA1C, repeat CBC and BMP   Psychotherapy:  Milieu, group therapy   Medications:   Patient was started on Abilify 15 mgrs QDAY- as reviewed with RN staff, after AM dose presented with decreased blood Pressure, dizziness  ( no fall)   Currently not dizzy but remains orthostatic . Will decrease Abilify dose to 5 mgrs QDAY D/C Ambien  Push PO fluids  Repeat CBC and BMP   Consultations:  As needed   Discharge Concerns:  -  Estimated LOS: 6 days   Other:     Physician Treatment Plan for Primary Diagnosis: psychosis Long Term Goal(s): Improvement in symptoms so as ready  for discharge  Short Term Goals: Ability to identify changes in lifestyle to reduce recurrence of condition will improve, Ability to verbalize feelings will improve, Ability to disclose and discuss suicidal ideas, Ability to demonstrate self-control will improve, Ability to identify and develop effective coping behaviors will improve, Ability to maintain clinical measurements within normal limits will improve and Compliance with prescribed medications will improve  Physician Treatment Plan for Secondary Diagnosis: Schizophrenia by history  Long Term Goal(s): Improvement in symptoms so as ready for discharge  Short Term Goals: Ability to verbalize feelings will improve, Ability to disclose and discuss suicidal ideas, Ability to demonstrate self-control will improve, Ability to identify and develop effective coping behaviors will improve, Ability to maintain clinical measurements within normal limits will improve, Compliance with prescribed medications will improve and Ability to identify triggers associated with substance abuse/mental health issues will improve  I certify that inpatient services furnished can reasonably be expected to improve the patient's condition.    Jenne Campus, MD 7/14/201812:18 PM

## 2016-07-29 NOTE — [facility] Suicide Risk Assessment (Signed)
[facility]   Nursing information obtained from:   patient and chart  Demographic factors:   41 year old single female  Current Mental Status:   see below Loss Factors:   chronic mental illness, medication non compliance  Historical Factors:   has been diagnosed with schizophrenia in the past , prior psychiatric admission Risk Reduction Factors:   resilience   Total Time spent with patient: 45 minutes Principal Problem:  Schizophrenia by history  Diagnosis:   Patient Active Problem List   Diagnosis Date Noted  . Schizoaffective disorder, bipolar type ([facility]) [F25.0] 07/28/2016  . 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 Schizophrenia ([facility]) [F20.9] 06/11/2015  . Psychosis [F29] 06/11/2015    Continued Clinical Symptoms:  Alcohol Use Disorder Identification Test Final Score (AUDIT): 0 The "Alcohol Use Disorders Identification Test", Guidelines for Use in Primary Care, Second Edition.  World Science writerHealth Organization [facility](WHO). Score between 0-7:  no or low risk or alcohol related problems. Score between 8-15:  moderate risk of alcohol related problems. Score between 16-19:  high risk of alcohol related problems. Score 20 or above:  warrants further diagnostic evaluation for alcohol dependence and treatment.   CLINICAL FACTORS:  41 year old female, prior history of Schizophrenia diagnosis, lives with an aunt.  Brought in by [employer] . Patient reports she was walking and cleaning up trash on the street, chart notes indicate patient appeared disorganized and internally preoccupied . At this time presents drowsy, but oriented x 3. Presents with disorganized thought process, tangentiality.  Has been non compliant with psychiatric medications   Psychiatric Specialty Exam: Physical Exam  ROS  Blood pressure (!) 90/57, pulse (!) 52, temperature 99 F (37.2 C), resp. rate 16, height 5' (1.524 m), weight 73.5 kg (162 lb), SpO2 97 %.Body mass index is 31.64  kg/m.   see admit note MSE     COGNITIVE FEATURES THAT CONTRIBUTE TO RISK:  Closed-mindedness and Loss of executive function    SUICIDE RISK:   Moderate:  Frequent suicidal ideation with limited intensity, and duration, some specificity in terms of plans, no associated intent, good self-control, limited dysphoria/symptomatology, some risk factors present, and identifiable protective factors, including available and accessible social support.  PLAN OF CARE: Patient will be admitted to inpatient psychiatric unit for stabilization and safety. Will provide and encourage milieu participation. Provide medication management and maked adjustments as needed.  Will follow daily.    I certify that inpatient services furnished can reasonably be expected to improve the patient's condition.   Craige CottaFernando A Viola Placeres, MD 07/29/2016, 1:16 PM

## 2016-07-30 DIAGNOSIS — R443 Hallucinations, unspecified: Secondary | ICD-10-CM

## 2016-07-30 DIAGNOSIS — F250 Schizoaffective disorder, bipolar type: Principal | ICD-10-CM

## 2016-07-30 DIAGNOSIS — G4700 Insomnia, unspecified: Secondary | ICD-10-CM

## 2016-07-30 LAB — CBC WITH DIFFERENTIAL/PLATELET
BASOS ABS: 0 10*3/uL (ref 0.0–0.1)
Basophils Relative: 0 %
Eosinophils Absolute: 0.1 10*3/uL (ref 0.0–0.7)
Eosinophils Relative: 1 %
HEMATOCRIT: 37.3 % (ref 36.0–46.0)
HEMOGLOBIN: 12.5 g/dL (ref 12.0–15.0)
LYMPHS PCT: 40 %
Lymphs Abs: 2.9 10*3/uL (ref 0.7–4.0)
MCH: 26.7 pg (ref 26.0–34.0)
MCHC: 33.5 g/dL (ref 30.0–36.0)
MCV: 79.5 fL (ref 78.0–100.0)
Monocytes Absolute: 0.9 10*3/uL (ref 0.1–1.0)
Monocytes Relative: 13 %
NEUTROS ABS: 3.2 10*3/uL (ref 1.7–7.7)
NEUTROS PCT: 46 %
Platelets: 338 10*3/uL (ref 150–400)
RBC: 4.69 MIL/uL (ref 3.87–5.11)
RDW: 13.7 % (ref 11.5–15.5)
WBC: 7.1 10*3/uL (ref 4.0–10.5)

## 2016-07-30 LAB — LIPID PANEL
CHOL/HDL RATIO: 3.1 ratio
Cholesterol: 166 mg/dL (ref 0–200)
HDL: 53 mg/dL (ref >40)
LDL CALC: 98 mg/dL (ref 0–99)
TRIGLYCERIDES: 77 mg/dL (ref <150)
VLDL: 15 mg/dL (ref 0–40)

## 2016-07-30 LAB — BASIC METABOLIC PANEL
ANION GAP: 11 (ref 5–15)
BUN: 11 mg/dL (ref 6–20)
CO2: 24 mmol/L (ref 22–32)
Calcium: 9.4 mg/dL (ref 8.9–10.3)
Chloride: 102 mmol/L (ref 101–111)
Creatinine, Ser: 0.81 mg/dL (ref 0.44–1.00)
GLUCOSE: 104 mg/dL — AB (ref 65–99)
POTASSIUM: 3.1 mmol/L — AB (ref 3.5–5.1)
SODIUM: 137 mmol/L (ref 135–145)

## 2016-07-30 MED ORDER — TRAZODONE HCL 100 MG PO TABS
100.0000 mg | ORAL_TABLET | Freq: Every evening | ORAL | Status: DC | PRN
  Administered 2016-07-30 – 2016-07-31 (×2): 100 mg via ORAL
  Filled 2016-07-30: qty 7
  Filled 2016-07-30 (×2): qty 1

## 2016-07-30 MED ORDER — ARIPIPRAZOLE 5 MG PO TABS
5.0000 mg | ORAL_TABLET | Freq: Two times a day (BID) | ORAL | Status: DC
  Administered 2016-07-30 – 2016-08-02 (×7): 5 mg via ORAL
  Filled 2016-07-30 (×7): qty 1
  Filled 2016-07-30: qty 14
  Filled 2016-07-30: qty 1
  Filled 2016-07-30: qty 14

## 2016-07-30 NOTE — Progress Notes (Signed)
Pt is very paranoid and suspicious , pt appears not able to tolerate a roommate at this time.

## 2016-07-30 NOTE — Progress Notes (Signed)
Pt has been up and down in the room pacing in her room, talking about spirits , scaring her roommate.

## 2016-07-30 NOTE — [facility] Suicide Risk Assessment (Signed)
[facility] INPATIENT:  Family/Significant Other Suicide Prevention Education  Suicide Prevention Education:  Education Completed;  Camelia EngKatherine Harrilson [phone] Midwife(Aunt)  has been identified by the patient as the family member/significant other with whom the patient will be residing, and identified as the person(s) who will aid the patient in the event of a mental health crisis (suicidal ideations/suicide attempt).  With written consent from the patient, the family member/significant other has been provided the following suicide prevention education, prior to the and/or following the discharge of the patient.   THERE WAS NO SUICIDAL IDEATION BEFORE OR DURING HOSPITALIZATION.  The suicide prevention education provided includes the following:  Suicide risk factors  Suicide prevention and interventions  National Suicide Hotline telephone number  [facility] assessment telephone number  [facility] Emergency Assistance 911  [facility] and/or Residential Mobile Crisis Unit telephone number  Request made of family/significant other to:  Remove weapons (e.g., guns, rifles, knives), all items previously/currently identified as safety concern.    Remove drugs/medications (over-the-counter, prescriptions, illicit drugs), all items previously/currently identified as a safety concern.  The family member/significant other verbalizes understanding of the suicide prevention education information provided.  The family member/significant other agrees to remove the items of safety concern listed above.   Carloyn JaegerMareida J Grossman-Orr 07/30/2016, 4:49 PM

## 2016-07-30 NOTE — Progress Notes (Signed)
D: Pt  Endorsed VH-but less denies SI/HI/AH. Pt is pleasant and cooperative. Pt stated she was doing better because the visual hallucinations are not as bad as they were when she came in. [place] because he has mental health training.   A: Pt was offered support and encouragement. Pt was given scheduled medications. Pt was encourage to attend groups. Q 15 minute checks were done for safety.    R:Pt attends groups and interacts well with peers and staff. Pt is taking medication. Pt has no complaints .Pt receptive to treatment and safety maintained on unit.

## 2016-07-30 NOTE — Social Work (Signed)
Clinical Social Work Note  Aunt Camelia EngKatherine Harrilson [phone] was contacted for collateral information.  Is concerned that pt is reliving her mother's death.  For instance, pulled out mother's will, pulled out note she found in mother's Bible when her mother passed away in August.  Has talked about dreams she has been having about mother.   Up until point of coming into hospital, had not slept since Monday.  Was aggressive this time with aunt in a way that she has never done before, grabbing things, digging fingernails into aunt's arm.  There have been other bizarre behaviors, losing clothing in the neighborhood, twisting mirrors on car so driver cannot see, leaving car doors open with keys in car, turning off all lights in the house when people are still up, yelling profanities.    Psychoeducation provided to aunt about pt's diagnosis of schizophrenia.    Patient will not be returning to live with Aunt, who is 75yo and does not feel she can handle her any longer.  Pt's cousin, Erskine SquibbRhonda [place] and has mental health training, has offered for her to come live there.  Aunt will call Rod North back with cousin's married name and phone number to follow up.    Ambrose MantleMareida Grossman-Orr, LCSW 07/30/2016, 4:57 PM

## 2016-07-30 NOTE — [facility] Group Notes (Signed)
[facility] Group Notes:  (Clinical Social Work)  07/30/2016  11:00AM-12:00PM  Summary of Progress/Problems:  The main focus of today's process group was to listen to a variety of genres of music and to identify that different types of music provoke different responses.  The patient then was able to identify personally what was soothing for them, as well as energizing, as well as how patient can personally use this knowledge in sleep habits, with depression, and with other symptoms.  The patient expressed at the beginning of group the overall feeling of tired at a 10 out of 10.  She woke up by the 3rd song and remained actively engaged throughout the remainder of group.  However, she was observed to "comfort" a patient who was not showing signs of distress, and was overheard asking a patient with multiple scars on arms if she wanted to confide about why she hurt herself.  Type of Therapy:  Music Therapy   Participation Level:  Active  Participation Quality:  Attentive and Supportive  Affect:  Blunted  Cognitive:  Delusional and Disorganized  Insight:  Limited  Engagement in Therapy:  Improving  Modes of Intervention:   Activity, Exploration  Ambrose MantleMareida Grossman-Orr, LCSW 07/30/2016

## 2016-07-30 NOTE — Progress Notes (Signed)
[facility] MD Progress Note  07/30/2016 10:13 AM [name]  MRN:  [id number] Subjective: Sarah Walton reports " I cant even talk about it, No body understands me at all, they hear me but don't understand."  Objective: Sarah Walton is awake, alert. Seen responding to internal stimuli. Patient is seen yelling and cussing. Patient present with irritable and aggressive behavior. Patient is redirectable with thoughts at times. Patient is unable to elaborate with who she is referring to with her thoughts.   Denies suicidal or homicidal ideation during this discussions. Denies auditory or visual hallucination, however patient is actively responding. Per staffing notes patient did take medication as prescribed. Support, encouragement and reassurance was provided.   Principal Problem: <principal problem not specified> Diagnosis:   Patient Active Problem List   Diagnosis Date Noted  . Schizoaffective disorder, bipolar type ([place]) [F25.0] 07/28/2016  . 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 Schizophrenia ([place]) [F20.9] 06/11/2015  . Psychosis [F29] 06/11/2015   Total Time spent with patient: 30 minutes  Past Psychiatric History:   Past Medical History:  Past Medical History:  Diagnosis Date  . Anxiety   . History of migraine headaches    History reviewed. No pertinent surgical history. Family History:  Family History  Problem Relation Age of Onset  . Anemia Mother    Family Psychiatric  History:  Social History:  History  Alcohol Use No     History  Drug Use No    Social History   Social History  . Marital status: Single    Spouse name: N/A  . Number of children: N/A  . Years of education: N/A   Social History Main Topics  . Smoking status: Never Smoker  . Smokeless tobacco: Never Used  . Alcohol use No  . Drug use: No  . Sexual activity: Not Asked   Other Topics Concern  . None   Social History Narrative  . None   Additional Social History:                          Sleep: Fair  Appetite:  Fair  Current Medications: Current Facility-Administered Medications  Medication Dose Route Frequency Provider Last Rate Last Dose  . acetaminophen (TYLENOL) tablet 650 mg  650 mg Oral Q6H PRN Okonkwo, Justina A, NP      . alum & mag hydroxide-simeth (MAALOX/MYLANTA) 200-200-20 MG/5ML suspension 30 mL  30 mL Oral Q4H PRN Okonkwo, Justina A, NP      . ARIPiprazole (ABILIFY) tablet 5 mg  5 mg Oral Daily Cobos, Myer Peer, MD   5 mg at 07/30/16 3810  . hydrOXYzine (ATARAX/VISTARIL) tablet 25 mg  25 mg Oral TID PRN Hughie Closs A, NP   25 mg at 07/29/16 2116  . ibuprofen (ADVIL,MOTRIN) tablet 600 mg  600 mg Oral Q8H PRN Okonkwo, Justina A, NP      . magnesium hydroxide (MILK OF MAGNESIA) suspension 30 mL  30 mL Oral Daily PRN Okonkwo, Justina A, NP      . ondansetron (ZOFRAN) tablet 4 mg  4 mg Oral Q8H PRN Okonkwo, Justina A, NP      . traZODone (DESYREL) tablet 50 mg  50 mg Oral QHS PRN Cobos, Myer Peer, MD   50 mg at 07/29/16 2115    Lab Results:  Results for orders placed or performed during the hospital encounter of 07/28/16 (from the past 48 hour(s))  Basic metabolic panel     Status:  Abnormal   Collection Time: 07/30/16  6:04 AM  Result Value Ref Range   Sodium 137 135 - 145 mmol/L   Potassium 3.1 (L) 3.5 - 5.1 mmol/L   Chloride 102 101 - 111 mmol/L   CO2 24 22 - 32 mmol/L   Glucose, Bld 104 (H) 65 - 99 mg/dL   BUN 11 6 - 20 mg/dL   Creatinine, Ser 0.81 0.44 - 1.00 mg/dL   Calcium 9.4 8.9 - 10.3 mg/dL   GFR calc non Af Amer >60 >60 mL/min   GFR calc Af Amer >60 >60 mL/min    Comment: (NOTE) The eGFR has been calculated using the CKD EPI equation. This calculation has not been validated in all clinical situations. eGFR's persistently <60 mL/min signify possible Chronic Kidney Disease.    Anion gap 11 5 - 15    Comment: Performed at [facility], [place] [address], [place], [place] [address]  CBC with  Differential/Platelet     Status: None   Collection Time: 07/30/16  6:04 AM  Result Value Ref Range   WBC 7.1 4.0 - 10.5 K/uL   RBC 4.69 3.87 - 5.11 MIL/uL   Hemoglobin 12.5 12.0 - 15.0 g/dL   HCT 37.3 36.0 - 46.0 %   MCV 79.5 78.0 - 100.0 fL   MCH 26.7 26.0 - 34.0 pg   MCHC 33.5 30.0 - 36.0 g/dL   RDW 13.7 11.5 - 15.5 %   Platelets 338 150 - 400 K/uL   Neutrophils Relative % 46 %   Neutro Abs 3.2 1.7 - 7.7 K/uL   Lymphocytes Relative 40 %   Lymphs Abs 2.9 0.7 - 4.0 K/uL   Monocytes Relative 13 %   Monocytes Absolute 0.9 0.1 - 1.0 K/uL   Eosinophils Relative 1 %   Eosinophils Absolute 0.1 0.0 - 0.7 K/uL   Basophils Relative 0 %   Basophils Absolute 0.0 0.0 - 0.1 K/uL    Comment: Performed at [facility], [place] [address], [place],  [address]    Blood Alcohol level:  Lab Results  Component Value Date   [facility] <5 07/28/2016   ETH <5 28/31/5176    Metabolic Disorder Labs: Lab Results  Component Value Date   HGBA1C 5.9 06/16/2015   Lab Results  Component Value Date   PROLACTIN 62.9 (H) 06/16/2015   Lab Results  Component Value Date   CHOL 127 06/16/2015   TRIG 50 06/16/2015   HDL 34 (L) 06/16/2015   CHOLHDL 3.7 06/16/2015   VLDL 10 06/16/2015   LDLCALC 83 06/16/2015    Physical Findings: AIMS: Facial and Oral Movements Muscles of Facial Expression: None, normal Lips and Perioral Area: None, normal Jaw: None, normal Tongue: None, normal,Extremity Movements Upper (arms, wrists, hands, fingers): None, normal Lower (legs, knees, ankles, toes): None, normal, Trunk Movements Neck, shoulders, hips: None, normal, Overall Severity Severity of abnormal movements (highest score from questions above): None, normal Incapacitation due to abnormal movements: None, normal Patient's awareness of abnormal movements (rate only patient's report): No Awareness, Dental Status Current problems with teeth and/or dentures?: No Does patient usually wear  dentures?: No  CIWA:    COWS:     Musculoskeletal: Strength & Muscle Tone: within normal limits Gait & Station: normal Patient leans: N/A  Psychiatric Specialty Exam: Physical Exam  Vitals reviewed. Constitutional: She appears well-developed.  Cardiovascular: Normal rate.   Neurological: She is alert.  Skin: Skin is warm.  Psychiatric: She has a normal mood  and affect. Her behavior is normal.    Review of Systems  Psychiatric/Behavioral: Positive for hallucinations. The patient is nervous/anxious and has insomnia.     Blood pressure 121/78, pulse (!) 106, temperature 98.9 F (37.2 C), temperature source Oral, resp. rate 20, height 5' (1.524 m), weight 73.5 kg (162 lb), SpO2 97 %.Body mass index is 31.64 kg/m.  General Appearance: Disheveled and Guarded  Eye Contact:  Minimal  Speech:  Pressured  Volume:  Normal with fluctuation   Mood:  Angry, Anxious and Irritable  Affect:  Depressed, Flat and Labile  Thought Process:  Disorganized  Orientation:  Other:  person and place  Thought Content:  Hallucinations: Auditory, Paranoid Ideation and Rumination  Suicidal Thoughts:  No  Homicidal Thoughts:  No  Memory:  Immediate;   Fair Recent;   Fair Remote;   Fair  Judgement:  Impaired  Insight:  Lacking  Psychomotor Activity:  Restlessness  Concentration:  Concentration: Fair  Recall:  [place] of Knowledge:  Poor  Language:  Fair  Akathisia:  No  Handed:  Right  AIMS (if indicated):     Assets:  Communication Skills Desire for Improvement Resilience Social Support  ADL's:  Intact  Cognition:  WNL  Sleep:  Number of Hours: 3.25     I agree with current treatment plan on 07/30/2016, Patient seen face-to-face for psychiatric evaluation follow-up, chart reviewed Reviewed the information documented and agree with the treatment plan.  Treatment Plan Summary: Daily contact with patient to assess and evaluate symptoms and progress in treatment and Medication  management  Increased Abiliy 5 mg QD to 19m PO BID  mood stabilization. Continue with Trazodone 50 mg for insomnia Will continue to monitor vitals ,medication compliance and treatment side effects while patient is here.  Reviewed labs: CSW will start working on disposition.  Patient to participate in therapeutic milieu   TDerrill Center NP 07/30/2016, 10:13 AM

## 2016-07-30 NOTE — Progress Notes (Signed)
Adult Psychoeducational Group Note  Date:  07/30/2016 Time:  8:37 PM  Group Topic/Focus:  Wrap-Up Group:   The focus of this group is to help patients review their daily goal of treatment and discuss progress on daily workbooks.  Participation Level:  Active  Participation Quality:  Appropriate  Affect:  Appropriate  Cognitive:  Oriented  Insight: Good  Engagement in Group:  Engaged  Modes of Intervention:  Discussion  Additional Comments:  Patient rated her day an 8. Pt stated she had a good day and goal is to concentrate more on important things and get ready for discharge.  Natasha MeadKiara M Girtie Wiersma 07/30/2016, 8:37 PM

## 2016-07-30 NOTE — [facility] Group Notes (Signed)
[facility] Group Notes:  (Nursing/MHT/Case Management/Adjunct)  Date:  07/30/2016  Time:  6:00 PM  Type of Therapy:  Nurse Education  Participation Level:  Active  Participation Quality:  Attentive  Affect:  Appropriate  Cognitive:  Alert and Oriented  Insight:  Good  Engagement in Group:  Engaged  Modes of Intervention:  guided meditation for anxiety  Summary of Progress/Problems: patient participated in the meditation and expressed the experience of deep relaxation at the end.   Jaleisa Brose M Mackensie Pilson 07/30/2016, 6:00 PM 

## 2016-07-31 NOTE — Tx Team (Signed)
Interdisciplinary Treatment and Diagnostic Plan Update  07/31/2016 Time of Session: 8:38 AM  Sarah Walton MRN: [id number]  Principal Diagnosis: <principal problem not specified>  Secondary Diagnoses: Active Problems:   Schizoaffective disorder, bipolar type (HCC)   Current Medications:  Current Facility-Administered Medications  Medication Dose Route Frequency Provider Last Rate Last Dose  . acetaminophen (TYLENOL) tablet 650 mg  650 mg Oral Q6H PRN Okonkwo, Justina A, NP      . alum & mag hydroxide-simeth (MAALOX/MYLANTA) 200-200-20 MG/5ML suspension 30 mL  30 mL Oral Q4H PRN Okonkwo, Justina A, NP      . ARIPiprazole (ABILIFY) tablet 5 mg  5 mg Oral BID Derrill Center, NP   5 mg at 07/31/16 0811  . hydrOXYzine (ATARAX/VISTARIL) tablet 25 mg  25 mg Oral TID PRN Hughie Closs A, NP   25 mg at 07/30/16 2144  . ibuprofen (ADVIL,MOTRIN) tablet 600 mg  600 mg Oral Q8H PRN Okonkwo, Justina A, NP      . magnesium hydroxide (MILK OF MAGNESIA) suspension 30 mL  30 mL Oral Daily PRN Okonkwo, Justina A, NP      . ondansetron (ZOFRAN) tablet 4 mg  4 mg Oral Q8H PRN Okonkwo, Justina A, NP      . traZODone (DESYREL) tablet 100 mg  100 mg Oral QHS PRN Lindon Romp A, NP   100 mg at 07/30/16 2144    PTA Medications: Prescriptions Prior to Admission  Medication Sig Dispense Refill Last Dose  . ARIPiprazole (ABILIFY) 15 MG tablet Take 1 tablet (15 mg total) by mouth daily. At bedtime (Patient not taking: Reported on 07/28/2016) 14 tablet 0 Not Taking at Unknown time  . ARIPiprazole (ABILIFY) 30 MG tablet Take 1 tablet (30 mg total) by mouth daily. (Patient not taking: Reported on 01/23/2016) 30 tablet 0 Not Taking at Unknown time  . ARIPiprazole 400 MG SUSR Inject 400 mg into the muscle every 30 (thirty) days. (Patient not taking: Reported on 01/23/2016) 1 each 0 Not Taking at Unknown time  . oseltamivir (TAMIFLU) 75 MG capsule Take 1 capsule (75 mg total) by mouth every 12 (twelve) hours. (Patient not  taking: Reported on 07/28/2016) 4 capsule 0 Not Taking at Unknown time    Patient Stressors: Medication change or noncompliance  Patient Strengths: Average or above average intelligence Capable of independent living General fund of knowledge Supportive family/friends  Treatment Modalities: Medication Management, Group therapy, Case management,  1 to 1 session with clinician, Psychoeducation, Recreational therapy.   Physician Treatment Plan for Primary Diagnosis: <principal problem not specified> Long Term Goal(s): Improvement in symptoms so as ready for discharge  Short Term Goals: Ability to identify changes in lifestyle to reduce recurrence of condition will improve Ability to verbalize feelings will improve Ability to disclose and discuss suicidal ideas Ability to demonstrate self-control will improve Ability to identify and develop effective coping behaviors will improve Ability to maintain clinical measurements within normal limits will improve Compliance with prescribed medications will improve Ability to verbalize feelings will improve Ability to disclose and discuss suicidal ideas Ability to demonstrate self-control will improve Ability to identify and develop effective coping behaviors will improve Ability to maintain clinical measurements within normal limits will improve Compliance with prescribed medications will improve Ability to identify triggers associated with substance abuse/mental health issues will improve  Medication Management: Evaluate patient's response, side effects, and tolerance of medication regimen.  Therapeutic Interventions: 1 to 1 sessions, Unit Group sessions and Medication administration.  Evaluation of Outcomes:  Progressing  Physician Treatment Plan for Secondary Diagnosis: Active Problems:   Schizoaffective disorder, bipolar type ([place])   Long Term Goal(s): Improvement in symptoms so as ready for discharge  Short Term Goals: Ability to  identify changes in lifestyle to reduce recurrence of condition will improve Ability to verbalize feelings will improve Ability to disclose and discuss suicidal ideas Ability to demonstrate self-control will improve Ability to identify and develop effective coping behaviors will improve Ability to maintain clinical measurements within normal limits will improve Compliance with prescribed medications will improve Ability to verbalize feelings will improve Ability to disclose and discuss suicidal ideas Ability to demonstrate self-control will improve Ability to identify and develop effective coping behaviors will improve Ability to maintain clinical measurements within normal limits will improve Compliance with prescribed medications will improve Ability to identify triggers associated with substance abuse/mental health issues will improve  Medication Management: Evaluate patient's response, side effects, and tolerance of medication regimen.  Therapeutic Interventions: 1 to 1 sessions, Unit Group sessions and Medication administration.  Evaluation of Outcomes: Progressing   RN Treatment Plan for Primary Diagnosis: <principal problem not specified> Long Term Goal(s): Knowledge of disease and therapeutic regimen to maintain health will improve  Short Term Goals: Ability to verbalize frustration and anger appropriately will improve, Ability to identify and develop effective coping behaviors will improve and Compliance with prescribed medications will improve  Medication Management: RN will administer medications as ordered by provider, will assess and evaluate patient's response and provide education to patient for prescribed medication. RN will report any adverse and/or side effects to prescribing provider.  Therapeutic Interventions: 1 on 1 counseling sessions, Psychoeducation, Medication administration, Evaluate responses to treatment, Monitor vital signs and CBGs as ordered,  Perform/monitor CIWA, COWS, AIMS and Fall Risk screenings as ordered, Perform wound care treatments as ordered.  Evaluation of Outcomes: Progressing   LCSW Treatment Plan for Primary Diagnosis: <principal problem not specified> Long Term Goal(s): Safe transition to appropriate next level of care at discharge, Engage patient in therapeutic group addressing interpersonal concerns.  Short Term Goals:  Identify comprehensive wellness plan   Therapeutic Interventions: Assess for all discharge needs, 1 to 1 time with Social worker, Explore available resources and support systems, Assess for adequacy in community support network, Educate family and significant other(s) on suicide prevention, Complete Psychosocial Assessment, Interpersonal group therapy.  Evaluation of Outcomes: Met   Progress in Treatment: Attending groups: Yes Participating in groups: Yes Taking medication as prescribed: Yes Toleration medication: Yes, no side effects reported at this time Family/Significant other contact made: No Patient understands diagnosis: Yes AEB asking for help with anger management, dealing with furstration Discussing patient identified problems/goals with staff: Yes Medical problems stabilized or resolved: Yes Denies suicidal/homicidal ideation: Yes Issues/concerns per patient self-inventory: None Other: N/A  New problem(s) identified: None identified at this time.   New Short Term/Long Term Goal(s): None identified at this time.   Discharge Plan or Barriers:   Reason for Continuation of Hospitalization: Mood instability RIS Medication stabilization   Estimated Length of Stay: 3-5 days  Attendees: Patient: 07/31/2016  8:38 AM  Physician: Hampton Abbot, MD 07/31/2016  8:38 AM  Nursing: Sena Hitch, RN 07/31/2016  8:38 AM  RN Care Manager: Lars Pinks, RN 07/31/2016  8:38 AM  Social Worker: Ripley Fraise 07/31/2016  8:38 AM  Recreational Therapist: Winfield Cunas 07/31/2016  8:38  AM  Other: Norberto Sorenson 07/31/2016  8:38 AM  Other:  07/31/2016  8:38 AM    Scribe for Treatment  Team:  Roque Lias LCSW 07/31/2016 8:38 AM

## 2016-07-31 NOTE — Progress Notes (Signed)
[facility] MD Progress Note  07/31/2016 10:55 AM Sarah Walton  MRN:  [id number]    Subjective: Sarah Walton reports "I am feeling okay, I just have a lot of family issues. Reports she has spoken to her aunt Juliann Pulse and aunt Pona regarding all the lies and misunderstanding.   Objective: Sarah Walton is awake, alert. Seen responding to internal stimuli however is redirectable with her thoughts. Patient is pleasant, cooperative and tangential.  Reports taken Abilify as prescribed without missing dose. States she is tolerating medications well. Patient continues to deny suicidal or homicidal ideation during this discussions. Patient reports a good appetite and states she is tolerating medications well. Patient reports she is attending group sessions. Support, encouragement and reassurance was provided.   Principal Problem: <principal problem not specified> Diagnosis:   Patient Active Problem List   Diagnosis Date Noted  . Schizoaffective disorder, bipolar type ([place]) [F25.0] 07/28/2016  . 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 Schizophrenia ([place]) [F20.9] 06/11/2015  . Psychosis [F29] 06/11/2015   Total Time spent with patient: 30 minutes  Past Psychiatric History:   Past Medical History:  Past Medical History:  Diagnosis Date  . Anxiety   . History of migraine headaches    History reviewed. No pertinent surgical history. Family History:  Family History  Problem Relation Age of Onset  . Anemia Mother    Family Psychiatric  History:  Social History:  History  Alcohol Use No     History  Drug Use No    Social History   Social History  . Marital status: Single    Spouse name: N/A  . Number of children: N/A  . Years of education: N/A   Social History Main Topics  . Smoking status: Never Smoker  . Smokeless tobacco: Never Used  . Alcohol use No  . Drug use: No  . Sexual activity: Not Asked   Other Topics Concern  . None   Social History Narrative  . None    Additional Social History:                         Sleep: Fair  Appetite:  Fair  Current Medications: Current Facility-Administered Medications  Medication Dose Route Frequency Provider Last Rate Last Dose  . acetaminophen (TYLENOL) tablet 650 mg  650 mg Oral Q6H PRN Okonkwo, Justina A, NP      . alum & mag hydroxide-simeth (MAALOX/MYLANTA) 200-200-20 MG/5ML suspension 30 mL  30 mL Oral Q4H PRN Okonkwo, Justina A, NP      . ARIPiprazole (ABILIFY) tablet 5 mg  5 mg Oral BID Derrill Center, NP   5 mg at 07/31/16 0811  . hydrOXYzine (ATARAX/VISTARIL) tablet 25 mg  25 mg Oral TID PRN Hughie Closs A, NP   25 mg at 07/30/16 2144  . ibuprofen (ADVIL,MOTRIN) tablet 600 mg  600 mg Oral Q8H PRN Okonkwo, Justina A, NP      . magnesium hydroxide (MILK OF MAGNESIA) suspension 30 mL  30 mL Oral Daily PRN Okonkwo, Justina A, NP      . ondansetron (ZOFRAN) tablet 4 mg  4 mg Oral Q8H PRN Okonkwo, Justina A, NP      . traZODone (DESYREL) tablet 100 mg  100 mg Oral QHS PRN Lindon Romp A, NP   100 mg at 07/30/16 2144    Lab Results:  Results for orders placed or performed during the hospital encounter of 07/28/16 (from the past 48 hour(s))  Basic metabolic panel     Status: Abnormal   Collection Time: 07/30/16  6:04 AM  Result Value Ref Range   Sodium 137 135 - 145 mmol/L   Potassium 3.1 (L) 3.5 - 5.1 mmol/L   Chloride 102 101 - 111 mmol/L   CO2 24 22 - 32 mmol/L   Glucose, Bld 104 (H) 65 - 99 mg/dL   BUN 11 6 - 20 mg/dL   Creatinine, Ser 0.81 0.44 - 1.00 mg/dL   Calcium 9.4 8.9 - 10.3 mg/dL   GFR calc non Af Amer >60 >60 mL/min   GFR calc Af Amer >60 >60 mL/min    Comment: (NOTE) The eGFR has been calculated using the CKD EPI equation. This calculation has not been validated in all clinical situations. eGFR's persistently <60 mL/min signify possible Chronic Kidney Disease.    Anion gap 11 5 - 15    Comment: Performed at [facility], [place] [address],  [place], [place] [address]  CBC with Differential/Platelet     Status: None   Collection Time: 07/30/16  6:04 AM  Result Value Ref Range   WBC 7.1 4.0 - 10.5 K/uL   RBC 4.69 3.87 - 5.11 MIL/uL   Hemoglobin 12.5 12.0 - 15.0 g/dL   HCT 37.3 36.0 - 46.0 %   MCV 79.5 78.0 - 100.0 fL   MCH 26.7 26.0 - 34.0 pg   MCHC 33.5 30.0 - 36.0 g/dL   RDW 13.7 11.5 - 15.5 %   Platelets 338 150 - 400 K/uL   Neutrophils Relative % 46 %   Neutro Abs 3.2 1.7 - 7.7 K/uL   Lymphocytes Relative 40 %   Lymphs Abs 2.9 0.7 - 4.0 K/uL   Monocytes Relative 13 %   Monocytes Absolute 0.9 0.1 - 1.0 K/uL   Eosinophils Relative 1 %   Eosinophils Absolute 0.1 0.0 - 0.7 K/uL   Basophils Relative 0 %   Basophils Absolute 0.0 0.0 - 0.1 K/uL    Comment: Performed at [facility], [place] [address] [place], [place] [address]  Lipid panel     Status: None   Collection Time: 07/30/16  6:04 AM  Result Value Ref Range   Cholesterol 166 0 - 200 mg/dL   Triglycerides 77 <150 mg/dL   HDL 53 >40 mg/dL   Total CHOL/HDL Ratio 3.1 RATIO   VLDL 15 0 - 40 mg/dL   LDL Cholesterol 98 0 - 99 mg/dL    Comment:        Total Cholesterol/HDL:CHD Risk Coronary Heart Disease Risk Table                     Men   Women  1/2 Average Risk   3.4   3.3  Average Risk       5.0   4.4  2 X Average Risk   9.6   7.1  3 X Average Risk  23.4   11.0        Use the calculated Patient Ratio above and the CHD Risk Table to determine the patient's CHD Risk.        ATP III CLASSIFICATION (LDL):  <100     mg/dL   Optimal  100-129  mg/dL   Near or Above                    Optimal  130-159  mg/dL   Borderline  160-189  mg/dL   High  >  190     mg/dL   Very High Performed at [facility], [place] [address], [place], [place] [address]     Blood Alcohol level:  Lab Results  Component Value Date   [facility] <5 07/28/2016   ETH <5 11/65/7903    Metabolic Disorder Labs: Lab Results  Component Value Date   HGBA1C 5.9 06/16/2015    Lab Results  Component Value Date   PROLACTIN 62.9 (H) 06/16/2015   Lab Results  Component Value Date   CHOL 166 07/30/2016   TRIG 77 07/30/2016   HDL 53 07/30/2016   CHOLHDL 3.1 07/30/2016   VLDL 15 07/30/2016   LDLCALC 98 07/30/2016   LDLCALC 83 06/16/2015    Physical Findings: AIMS: Facial and Oral Movements Muscles of Facial Expression: None, normal Lips and Perioral Area: None, normal Jaw: None, normal Tongue: None, normal,Extremity Movements Upper (arms, wrists, hands, fingers): None, normal Lower (legs, knees, ankles, toes): None, normal, Trunk Movements Neck, shoulders, hips: None, normal, Overall Severity Severity of abnormal movements (highest score from questions above): None, normal Incapacitation due to abnormal movements: None, normal Patient's awareness of abnormal movements (rate only patient's report): No Awareness, Dental Status Current problems with teeth and/or dentures?: No Does patient usually wear dentures?: No  CIWA:    COWS:     Musculoskeletal: Strength & Muscle Tone: within normal limits Gait & Station: normal Patient leans: N/A  Psychiatric Specialty Exam: Physical Exam  Vitals reviewed. Constitutional: She appears well-developed.  Cardiovascular: Normal rate.   Neurological: She is alert.  Skin: Skin is warm.  Psychiatric: She has a normal mood and affect. Her behavior is normal.    Review of Systems  Psychiatric/Behavioral: Positive for hallucinations. The patient is nervous/anxious and has insomnia.     Blood pressure 116/77, pulse 97, temperature 98.7 F (37.1 C), temperature source Oral, resp. rate 20, height 5' (1.524 m), weight 73.5 kg (162 lb), SpO2 97 %.Body mass index is 31.64 kg/m.  General Appearance: Fairly Groomed  Eye Contact:  Minimal  Speech:  Pressured  Volume:  Normal with fluctuation   Mood:  Angry, Anxious and Irritable  Affect:  Depressed, Flat and Labile  Thought Process:  Disorganized  Orientation:   Other:  person and place  Thought Content:  Hallucinations: Auditory, Paranoid Ideation and Rumination  Suicidal Thoughts:  No  Homicidal Thoughts:  No  Memory:  Immediate;   Fair Recent;   Fair Remote;   Fair  Judgement:  Impaired  Insight:  Lacking  Psychomotor Activity:  Restlessness  Concentration:  Concentration: Fair  Recall:  [place] of Knowledge:  Poor  Language:  Fair  Akathisia:  No  Handed:  Right  AIMS (if indicated):     Assets:  Communication Skills Desire for Improvement Social Support  ADL's:  Intact  Cognition:  WNL  Sleep:  Number of Hours: 6.25     I agree with current treatment plan on 07/31/2016, Patient seen face-to-face for psychiatric evaluation follow-up, chart reviewed Reviewed the information documented and agree with the treatment plan.  Treatment Plan Summary: Daily contact with patient to assess and evaluate symptoms and progress in treatment and Medication management  Continue  Abiliy 5 mg QD to 59m PO BID  mood stabilization. (tiration)   Continue with Trazodone 50 mg for insomnia Will continue to monitor vitals ,medication compliance and treatment side effects while patient is here.  Reviewed labs: CSW will start working on disposition.  Patient to participate in therapeutic  milieu   Derrill Center, NP 07/31/2016, 10:55 AM

## 2016-07-31 NOTE — Progress Notes (Signed)
Recreation Therapy Notes  Date: 07/31/2016 Time: 10:00am Location: [address] Dayroom  Group Topic: Coping Skills and Self-Esteem  Goal Area(s) Addresses:  Pt will be able to identify traits about themselves that are unique. Pt will be able to identify coping skills for different emotions.  Behavioral Response: Engaged  Intervention: Game  Activity: Cards. Pt will receive two cards and depending on the number they have they will have to answer the question or perform the action that has been previously identified by recreation therapy intern. The question pertain to coping skills and self-esteem.  Education: PharmacologistCoping Skills, Self-Esteem, Discharge Planning  Education Outcome: Acknowledges understanding   Clinical Observations/Feedback: Pt participated in activity. Pt identified things she was grateful for, coping skills and the things that made her unique. Pt respectfully listened to peers during processing discussion.  Marvell Fullerachel Peachie Barkalow, Recreation Therapy Intern

## 2016-07-31 NOTE — Progress Notes (Signed)
Recreation Therapy Notes  INPATIENT RECREATION THERAPY ASSESSMENT  Patient Details Name: [name] MRN: [id number] DOB: November 21, 1975 Today's Date: 07/31/2016  Pt reported she was admitted because her family brought her here.  Patient Stressors: Family   Pt reported she has "family issues."  Pt reported she has some problems with her aunt. Pt reported her aunt "doesn't want to talk about anything." Pt reported that her aunt "thinks everything is a mental issue."  Coping Skills:   Isolate, Music, Sports  Personal Challenges: Anger, Communication, Concentration, Decision-Making, Expressing Yourself, Problem-Solving, Relationships, Social Interaction, Stress Management, Trusting Others  Leisure Interests (2+):  Individual - Phone, Individual - TV  Awareness of Community Resources:  Yes  Community Resources:  Research scientist (physical sciences)Movie Theaters, Engineering geologistLibrary  Current Use: Yes  If no, Barriers?:    Patient Strengths:  taking care of other and caring for others  Patient Identified Areas of Improvement:  attitude  Current Recreation Participation:  everyday  Patient Goal for Hospitalization:  "to get out"  [place]  County of Residence:  [place]   Current SI (including self-harm):  No  Current HI:  No  Consent to Intern Participation: Yes  Marvell FullerRachel Mayan Walton, Recreation Therapy Intern   Marvell FullerRachel Kymorah Walton 07/31/2016, 3:55 PM

## 2016-07-31 NOTE — Progress Notes (Signed)
DAR NOTE: Patient presents with bright affect and calm mood.  Denies pain, auditory and visual hallucinations.  Rates depression at 0, hopelessness at 0, and anxiety at 0.  Maintained on routine safety checks.  Medications given as prescribed.  Support and encouragement offered as needed.  Attended group and participated. Patient observed socializing with peers in the dayroom.  Offered no complaint.

## 2016-07-31 NOTE — Progress Notes (Signed)
Adult Psychoeducational Group Note  Date:  07/31/2016 Time:  8:36 PM  Pt did not attend wrap-up group.

## 2016-07-31 NOTE — [facility] Group Notes (Signed)
[facility] LCSW Group Therapy  07/31/2016 1:15 pm  Type of Therapy: Process Group Therapy  Participation Level:  Active  Participation Quality:  Appropriate  Affect:  Flat  Cognitive:  Oriented  Insight:  Improving  Engagement in Group:  Limited  Engagement in Therapy:  Limited  Modes of Intervention:  Activity, Clarification, Education, Problem-solving and Support  Summary of Progress/Problems: Today's group addressed the issue of overcoming obstacles.  Patients were asked to identify their biggest obstacle post d/c that stands in the way of their on-going success, and then problem solve as to how to manage this. Stayed the entire time, engaged throughout. "My anger with my family gets me in trouble.  That's how I ended up here. I think I need anger management classes."  [facility], but unwilling to admit that had anything to do with current admission.  Daryel Geraldorth, Sundance Moise B 07/31/2016   3:26 PM

## 2016-08-01 ENCOUNTER — Encounter (HOSPITAL_COMMUNITY): Payer: Self-pay | Admitting: Behavioral Health

## 2016-08-01 DIAGNOSIS — F39 Unspecified mood [affective] disorder: Secondary | ICD-10-CM

## 2016-08-01 DIAGNOSIS — Z79899 Other long term (current) drug therapy: Secondary | ICD-10-CM

## 2016-08-01 LAB — PROLACTIN: Prolactin: 58.2 ng/mL — ABNORMAL HIGH (ref 4.8–23.3)

## 2016-08-01 LAB — HEMOGLOBIN A1C
Hgb A1c MFr Bld: 5.9 % — ABNORMAL HIGH (ref 4.8–5.6)
MEAN PLASMA GLUCOSE: 123 mg/dL

## 2016-08-01 NOTE — Progress Notes (Signed)
D: Pt denies SI/HI/AVH. Pt is pleasant and cooperative. Pt stated she had a rough day but would not elaborate to writer A: Pt was offered support and encouragement. Pt was given scheduled medications. Pt was encourage to attend groups. Q 15 minute checks were done for safety.   R: safety maintained on unit.

## 2016-08-01 NOTE — Progress Notes (Signed)
[facility] MD Progress Note  08/01/2016 2:47 PM JANAIYAH BLACKARD  MRN:  [id number]    Subjective: Rashel reports "I'm doing fine. Better than it could be. Just have been trying to adjust with my mom passing away. I am staying with my aunt and we don't see eye to eye. I am hoping to go stay with my other aunt when I leave here. She stays in an assisted living."   Objective: Face to face evaluation completed, case discussed during treatment team, chart reviewed. During this evaluation, Verdell  is awake, alert. She denies AVH and paranoia although she appears to be responding to internal stimuli and appears paranoid. She presents as calm and cooperative although tangential. As per nursing, "Pt is very paranoid, pt appears delusional and appears to be responding but continues to deny at this time. Pt presents with disorganized thoughts, loose associations, irritable. Pt came out of her room and sat in the hall wanting to sit there, but it was explained to pt that she had her own room and she could sit in there. Pt presented to the nurses station delusional and disorganized and when asked what she was talking about pt continued to ramble incoherently."   Patient continues to take medications as prescribed and denies current side effects.  She seems to be tolerating medications well.  She denies suicidal thoughts, homicidal ideas, with plan or intent. Reports sleeping pattern and appetite as fair. As per staff and patient, she has attended group sessions with active participation.  Support, encouragement and reassurance was provided. She is able to contract for safety at this time.   Principal Problem: Schizoaffective disorder, bipolar type ([facility]) Diagnosis:   Patient Active Problem List   Diagnosis Date Noted  . Schizoaffective disorder, bipolar type ([facility]) [F25.0] 07/28/2016  . 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 Schizophrenia ([facility]) [F20.9] 06/11/2015  . Psychosis [F29] 06/11/2015    Total Time spent with patient: 30 minutes  Past Psychiatric History:   Past Medical History:  Past Medical History:  Diagnosis Date  . Anxiety   . History of migraine headaches    History reviewed. No pertinent surgical history. Family History:  Family History  Problem Relation Age of Onset  . Anemia Mother    Family Psychiatric  History:  Social History:  History  Alcohol Use No     History  Drug Use No    Social History   Social History  . Marital status: Single    Spouse name: N/A  . Number of children: N/A  . Years of education: N/A   Social History Main Topics  . Smoking status: Never Smoker  . Smokeless tobacco: Never Used  . Alcohol use No  . Drug use: No  . Sexual activity: Not Asked   Other Topics Concern  . None   Social History Narrative  . None   Additional Social History:      Sleep: Fair  Appetite:  Fair  Current Medications: Current Facility-Administered Medications  Medication Dose Route Frequency Provider Last Rate Last Dose  . acetaminophen (TYLENOL) tablet 650 mg  650 mg Oral Q6H PRN Okonkwo, Justina A, NP      . alum & mag hydroxide-simeth (MAALOX/MYLANTA) 200-200-20 MG/5ML suspension 30 mL  30 mL Oral Q4H PRN Okonkwo, Justina A, NP      . ARIPiprazole (ABILIFY) tablet 5 mg  5 mg Oral BID Oneta Rack, NP   5 mg at 08/01/16 0805  . hydrOXYzine (ATARAX/VISTARIL) tablet 25 mg  25 mg Oral TID PRN Ferne Reus A, NP   25 mg at 07/31/16 2159  . ibuprofen (ADVIL,MOTRIN) tablet 600 mg  600 mg Oral Q8H PRN Okonkwo, Justina A, NP      . magnesium hydroxide (MILK OF MAGNESIA) suspension 30 mL  30 mL Oral Daily PRN Okonkwo, Justina A, NP      . ondansetron (ZOFRAN) tablet 4 mg  4 mg Oral Q8H PRN Okonkwo, Justina A, NP      . traZODone (DESYREL) tablet 100 mg  100 mg Oral QHS PRN Nira Conn A, NP   100 mg at 07/31/16 2159    Lab Results:  No results found for this or any previous visit (from the past 48 hour(s)).  Blood Alcohol  level:  Lab Results  Component Value Date   ETH <5 07/28/2016   ETH <5 01/24/2016    Metabolic Disorder Labs: Lab Results  Component Value Date   HGBA1C 5.9 (H) 07/30/2016   MPG 123 07/30/2016   Lab Results  Component Value Date   PROLACTIN 58.2 (H) 07/30/2016   PROLACTIN 62.9 (H) 06/16/2015   Lab Results  Component Value Date   CHOL 166 07/30/2016   TRIG 77 07/30/2016   HDL 53 07/30/2016   CHOLHDL 3.1 07/30/2016   VLDL 15 07/30/2016   LDLCALC 98 07/30/2016   LDLCALC 83 06/16/2015    Physical Findings: AIMS: Facial and Oral Movements Muscles of Facial Expression: None, normal Lips and Perioral Area: None, normal Jaw: None, normal Tongue: None, normal,Extremity Movements Upper (arms, wrists, hands, fingers): None, normal Lower (legs, knees, ankles, toes): None, normal, Trunk Movements Neck, shoulders, hips: None, normal, Overall Severity Severity of abnormal movements (highest score from questions above): None, normal Incapacitation due to abnormal movements: None, normal Patient's awareness of abnormal movements (rate only patient's report): No Awareness, Dental Status Current problems with teeth and/or dentures?: No Does patient usually wear dentures?: No  CIWA:    COWS:     Musculoskeletal: Strength & Muscle Tone: within normal limits Gait & Station: normal Patient leans: N/A  Psychiatric Specialty Exam: Physical Exam  Nursing note and vitals reviewed. Neurological: She is alert.  Skin: Skin is warm.  Psychiatric: She has a normal mood and affect. Her behavior is normal.    Review of Systems  Psychiatric/Behavioral: Positive for hallucinations. The patient is nervous/anxious and has insomnia.     Blood pressure 129/82, pulse (!) 113, temperature 99.2 F (37.3 C), resp. rate 20, height 5' (1.524 m), weight 162 lb (73.5 kg), SpO2 97 %.Body mass index is 31.64 kg/m.  General Appearance: Fairly Groomed  Eye Contact:  Minimal  Speech:  Pressured   Volume:  Normal with fluctuation   Mood:  Anxious  Affect:  Depressed, Flat and Labile  Thought Process:  Disorganized  Orientation:  Other:  person and place  Thought Content:  Paranoid Ideation and Rumination; denies AVH at this time. At time, does appear internally preoccupied.   Suicidal Thoughts:  No  Homicidal Thoughts:  No  Memory:  Immediate;   Fair Recent;   Fair Remote;   Fair  Judgement:  Impaired  Insight:  Lacking  Psychomotor Activity:  Restlessness  Concentration:  Concentration: Fair  Recall:  Fair  Fund of Knowledge:  Poor  Language:  Fair  Akathisia:  No  Handed:  Right  AIMS (if indicated):     Assets:  Communication Skills Desire for Improvement Social Support  ADL's:  Intact  Cognition:  WNL  Sleep:  Number of Hours: 6.75   Treatment Plan Summary: Reviewed current treatment plan. Will continue current treatment plan with no adjustments at this time;   Daily contact with patient to assess and evaluate symptoms and progress in treatment and Medication management  Continue  Abiliy 5 mg QD to 5mg  PO BID  mood stabilization. (tiration started 07/31/2016)   Continue with Trazodone 50 mg for insomnia Will continue to monitor vitals ,medication compliance and treatment side effects while patient is here.  Reviewed labs: No new labs resulted today.  CSW will start working on disposition.  Patient to participate in therapeutic milieu   Denzil MagnusonLaShunda Caitlinn Klinker, NP 08/01/2016, 2:47 PM   Patient ID: Burnard HawthorneYolanda M Mignano, female   DOB: 1975-10-19, 41 y.o.   MRN: [id number]

## 2016-08-01 NOTE — Progress Notes (Signed)
Recreation Therapy Notes Date: 08/01/2016 Time: 10:00am Location: [address] Dayroom   Group Topic: Decision Making  Goal Area(s) Addresses:  Pts will successfully identify how their choices affect other people in their lives. Pts will successfully identify how to overcome an obstacle they may face when someone makes a poor choice around them.  Behavioral Response: Engaged  Intervention: Game  Activity: Pts will work in teams to create as many words they can with the letters they chose. Pts will pick letters independently to use with their team for the first round and for the secound round pts will pick letters as a team.  Education: Decision Making, Discharge Planning  Education Outcome: Acknowledges understanding  Clinical Observations/Feedback: Pt actively participated in activity. Approximately 10:40am pt left group as processing discussion began and did not return.  [name], Recreation Therapy Intern  Caroll RancherMarjette Romuald Mccaslin, LRT/CTRS

## 2016-08-01 NOTE — Progress Notes (Signed)
DAR NOTE: Pt present with bright affect and jovial  mood in the unit. Pt has been interacting with peers in the day. Pt denies physical pain, took all her meds as scheduled. As per self inventory, pt had a good night sleep, good appetite, normal energy, and good concentration. Pt rate depression at 0, hopeless ness at 0, and anxiety at 0. Pt's safety ensured with 15 minute and environmental checks. Pt currently denies SI/HI and A/V hallucinations. Pt verbally agrees to seek staff if SI/HI or A/VH occurs and to consult with staff before acting on these thoughts. Will continue POC.

## 2016-08-01 NOTE — Progress Notes (Signed)
Adult Psychoeducational Group Note  Date:  08/01/2016 Time:  9:18 PM  Group Topic/Focus:  Wrap-Up Group:   The focus of this group is to help patients review their daily goal of treatment and discuss progress on daily workbooks.  Participation Level:  Did Not Attend  Additional Comments:  Pt did not attend wrap-up group.  Berlin Hunuttle, Ridley Schewe M 08/01/2016, 9:18 PM

## 2016-08-01 NOTE — [facility] Group Notes (Signed)
[facility] LCSW Group Therapy  08/01/2016 3:17 PM   Type of Therapy:  Group Therapy  Participation Level:  Active  Participation Quality:  Attentive  Affect:  Appropriate  Cognitive:  Appropriate  Insight:  Improving  Engagement in Therapy:  Engaged  Modes of Intervention:  Clarification, Education, Exploration and Socialization  Summary of Progress/Problems: Today's group focused on relapse prevention.  We defined the term, and then brainstormed on ways to prevent relapse. Stayed the entire time, engaged throughout.  Showed good insight when talking about mental health issues in her family and how that carries over to both she and her relationship with current supportive family members. Talked about the loss of her mother a year ago and how helpful it was to go through hospice groups.  Invested in taking meds and going to therapy when d/ced.  Daryel Geraldorth, Amandalee Lacap B 08/01/2016 , 3:17 PM

## 2016-08-02 MED ORDER — ARIPIPRAZOLE 5 MG PO TABS: 5.0000 mg | ORAL_TABLET | Freq: Two times a day (BID) | ORAL | 0 refills | Status: AC

## 2016-08-02 MED ORDER — HYDROXYZINE HCL 25 MG PO TABS: 25.0000 mg | ORAL_TABLET | Freq: Three times a day (TID) | ORAL | 0 refills | Status: AC | PRN

## 2016-08-02 MED ORDER — TRAZODONE HCL 100 MG PO TABS: 100.0000 mg | ORAL_TABLET | Freq: Every evening | ORAL | 0 refills | Status: AC | PRN

## 2016-08-02 NOTE — Plan of Care (Signed)
Problem: [facility] Participation in Recreation Therapeutic Interventions Goal: STG-Patient will demonstrate improved communication skills b STG: Communication - Patient will improve communication skills, as demonstrated by ability to actively participate in at least 2 processing discussion during recreation therapy group sessions by conclusion of recreation therapy tx  Outcome: Completed/Met Date Met: 08/02/16 Pt successfully improved her communication skills by actively participating in discussions during all of the recreation therapy groups she attended.  Donovan Kail, Recreation Therapy Intern

## 2016-08-02 NOTE — Progress Notes (Signed)
Recreation Therapy Notes  INPATIENT RECREATION TR PLAN  Patient Details Name: TOSHIKA PARROW MRN: [id number] DOB: June 25, 1975 Today's Date: 08/02/2016  Rec Therapy Plan Is patient appropriate for Therapeutic Recreation?: Yes Treatment times per week: at least 3x Estimated Length of Stay: 5-7 days TR Treatment/Interventions: Group participation (Comment) (appropriate participation in recreation therapy group sessions)  Discharge Criteria Pt will be discharged from therapy if:: Discharged Treatment plan/goals/alternatives discussed and agreed upon by:: Patient/family  Discharge Summary Short term goals set: Patient will improve communication skills, as demonstrated by ability to actively participate in at least 2 processing discussion during recreation therapy group sessions by conclusion of recreation therapy tx  Short term goals met: Complete Progress toward goals comments: Groups attended Which groups?: Self-esteem, Coping skills, Problem-solving, decision making Reason goals not met: n/a Therapeutic equipment acquired: n/a Reason patient discharged from therapy: Discharge from hospital Pt/family agrees with progress & goals achieved: Yes Date patient discharged from therapy: 08/02/16  Donovan Kail, Recreation Therapy Intern  Donovan Kail 08/02/2016, 11:31 AM   Victorino Sparrow, LRT/CTRS

## 2016-08-02 NOTE — Tx Team (Signed)
Interdisciplinary Treatment and Diagnostic Plan Update  08/02/2016 Time of Session: 10:19 AM  Sarah Walton MRN: [id number]  Principal Diagnosis: Schizoaffective disorder, bipolar type ([place])  Secondary Diagnoses: Principal Problem:   Schizoaffective disorder, bipolar type ([place])   Current Medications:  Current Facility-Administered Medications  Medication Dose Route Frequency Provider Last Rate Last Dose  . acetaminophen (TYLENOL) tablet 650 mg  650 mg Oral Q6H PRN Okonkwo, Justina A, NP      . alum & mag hydroxide-simeth (MAALOX/MYLANTA) 200-200-20 MG/5ML suspension 30 mL  30 mL Oral Q4H PRN Okonkwo, Justina A, NP      . ARIPiprazole (ABILIFY) tablet 5 mg  5 mg Oral BID Derrill Center, NP   5 mg at 08/02/16 0801  . hydrOXYzine (ATARAX/VISTARIL) tablet 25 mg  25 mg Oral TID PRN Hughie Closs A, NP   25 mg at 07/31/16 2159  . ibuprofen (ADVIL,MOTRIN) tablet 600 mg  600 mg Oral Q8H PRN Okonkwo, Justina A, NP      . magnesium hydroxide (MILK OF MAGNESIA) suspension 30 mL  30 mL Oral Daily PRN Okonkwo, Justina A, NP      . ondansetron (ZOFRAN) tablet 4 mg  4 mg Oral Q8H PRN Okonkwo, Justina A, NP      . traZODone (DESYREL) tablet 100 mg  100 mg Oral QHS PRN Lindon Romp A, NP   100 mg at 07/31/16 2159    PTA Medications: Prescriptions Prior to Admission  Medication Sig Dispense Refill Last Dose  . ARIPiprazole (ABILIFY) 15 MG tablet Take 1 tablet (15 mg total) by mouth daily. At bedtime (Patient not taking: Reported on 07/28/2016) 14 tablet 0 Not Taking at Unknown time  . ARIPiprazole (ABILIFY) 30 MG tablet Take 1 tablet (30 mg total) by mouth daily. (Patient not taking: Reported on 01/23/2016) 30 tablet 0 Not Taking at Unknown time  . ARIPiprazole 400 MG SUSR Inject 400 mg into the muscle every 30 (thirty) days. (Patient not taking: Reported on 01/23/2016) 1 each 0 Not Taking at Unknown time  . oseltamivir (TAMIFLU) 75 MG capsule Take 1 capsule (75 mg total) by mouth every 12 (twelve) hours.  (Patient not taking: Reported on 07/28/2016) 4 capsule 0 Not Taking at Unknown time    Patient Stressors: Medication change or noncompliance  Patient Strengths: Average or above average intelligence Capable of independent living General fund of knowledge Supportive family/friends  Treatment Modalities: Medication Management, Group therapy, Case management,  1 to 1 session with clinician, Psychoeducation, Recreational therapy.   Physician Treatment Plan for Primary Diagnosis: Schizoaffective disorder, bipolar type ([place]) Long Term Goal(s): Improvement in symptoms so as ready for discharge  Short Term Goals: Ability to identify changes in lifestyle to reduce recurrence of condition will improve Ability to verbalize feelings will improve Ability to disclose and discuss suicidal ideas Ability to demonstrate self-control will improve Ability to identify and develop effective coping behaviors will improve Ability to maintain clinical measurements within normal limits will improve Compliance with prescribed medications will improve Ability to verbalize feelings will improve Ability to disclose and discuss suicidal ideas Ability to demonstrate self-control will improve Ability to identify and develop effective coping behaviors will improve Ability to maintain clinical measurements within normal limits will improve Compliance with prescribed medications will improve Ability to identify triggers associated with substance abuse/mental health issues will improve  Medication Management: Evaluate patient's response, side effects, and tolerance of medication regimen.  Therapeutic Interventions: 1 to 1 sessions, Unit Group sessions and Medication administration.  Evaluation  of Outcomes: Adequate for Discharge  Physician Treatment Plan for Secondary Diagnosis: Principal Problem:   Schizoaffective disorder, bipolar type ([place])   Long Term Goal(s): Improvement in symptoms so as ready for  discharge  Short Term Goals: Ability to identify changes in lifestyle to reduce recurrence of condition will improve Ability to verbalize feelings will improve Ability to disclose and discuss suicidal ideas Ability to demonstrate self-control will improve Ability to identify and develop effective coping behaviors will improve Ability to maintain clinical measurements within normal limits will improve Compliance with prescribed medications will improve Ability to verbalize feelings will improve Ability to disclose and discuss suicidal ideas Ability to demonstrate self-control will improve Ability to identify and develop effective coping behaviors will improve Ability to maintain clinical measurements within normal limits will improve Compliance with prescribed medications will improve Ability to identify triggers associated with substance abuse/mental health issues will improve  Medication Management: Evaluate patient's response, side effects, and tolerance of medication regimen.  Therapeutic Interventions: 1 to 1 sessions, Unit Group sessions and Medication administration.  Evaluation of Outcomes: Adequate for Discharge   RN Treatment Plan for Primary Diagnosis: Schizoaffective disorder, bipolar type ([place]) Long Term Goal(s): Knowledge of disease and therapeutic regimen to maintain health will improve  Short Term Goals: Ability to verbalize frustration and anger appropriately will improve, Ability to identify and develop effective coping behaviors will improve and Compliance with prescribed medications will improve  Medication Management: RN will administer medications as ordered by provider, will assess and evaluate patient's response and provide education to patient for prescribed medication. RN will report any adverse and/or side effects to prescribing provider.  Therapeutic Interventions: 1 on 1 counseling sessions, Psychoeducation, Medication administration, Evaluate responses to  treatment, Monitor vital signs and CBGs as ordered, Perform/monitor CIWA, COWS, AIMS and Fall Risk screenings as ordered, Perform wound care treatments as ordered.  Evaluation of Outcomes: Adequate for Discharge   Recreational Therapy Treatment Plan for Primary Diagnosis: Schizoaffective disorder, bipolar type ([place]) Long Term Goal(s): Patient will participate in recreation therapy treatment in at least 2 group sessions without prompting from LRT  Short Term Goals: Communication-Without prompting or encouragement, patient will spontaneously contribute to discussions during at least 2 recreation therapy group sessions by conclusion of recreation therapy  Treatment Modalities: Group and Pet Therapy  Therapeutic Interventions: Psychoeducation  Evaluation of Outcomes: Progressing   LCSW Treatment Plan for Primary Diagnosis: Schizoaffective disorder, bipolar type ([place]) Long Term Goal(s): Safe transition to appropriate next level of care at discharge, Engage patient in therapeutic group addressing interpersonal concerns.  Short Term Goals:  Identify comprehensive wellness plan   Therapeutic Interventions: Assess for all discharge needs, 1 to 1 time with Social worker, Explore available resources and support systems, Assess for adequacy in community support network, Educate family and significant other(s) on suicide prevention, Complete Psychosocial Assessment, Interpersonal group therapy.  Evaluation of Outcomes: Met     Progress in Treatment: Attending groups: Yes Participating in groups: Yes Taking medication as prescribed: Yes Toleration medication: Yes, no side effects reported at this time Family/Significant other contact made: No Patient understands diagnosis: Yes AEB asking for help with anger management, dealing with furstration Discussing patient identified problems/goals with staff: Yes Medical problems stabilized or resolved: Yes Denies suicidal/homicidal ideation:  Yes Issues/concerns per patient self-inventory: None Other: N/A  New problem(s) identified: None identified at this time.   New Short Term/Long Term Goal(s): None identified at this time.   Discharge Plan or Barriers:  Return to aunt temporarily in  [place]-then move to [facility] over the weekend, follow up [place]  Reason for Continuation of Hospitalization:    Estimated Length of Stay: D/C today  Attendees: Patient: 08/02/2016  10:19 AM  Physician: Hampton Abbot, MD 08/02/2016  10:19 AM  Nursing: Sena Hitch, RN 08/02/2016  10:19 AM  RN Care Manager: Lars Pinks, RN 08/02/2016  10:19 AM  Social Worker: Ripley Fraise 08/02/2016  10:19 AM  Recreational Therapist: Victorino Sparrow, LRT/CTRS 08/02/2016  10:19 AM  Other: Norberto Sorenson 08/02/2016  10:19 AM  Other: Donovan Kail, Recreation Therapy Intern 08/02/2016  10:19 AM    Scribe for Treatment Team:  Roque Lias LCSW 08/02/2016 10:19 AM

## 2016-08-02 NOTE — Progress Notes (Signed)
Discharge- Patient verbalizes readiness for discharge: Denies SI/HI, is not psychotic or delusional. A- Discharge instruction read and discussed with patient. No belongings prior to admission.  R- Patient verbalize understanding of discharge instructions. Escorted to the lobby.

## 2016-08-02 NOTE — Progress Notes (Signed)
[facility] Group Notes:  (Nursing/MHT/Case Management/Adjunct)  Date:  08/02/2016  Time:  9:12 PM  Type of Therapy:  Psychoeducational Skills  Participation Level:  Active  Participation Quality:  Attentive  Affect:  Appropriate  Cognitive:  Appropriate  Insight:  Good  Engagement in Group:  Developing/Improving  Modes of Intervention:  Education  Summary of Progress/Problems: The patient verbalized that she had a good day despite having had a bad appetite. In terms of the theme for the day, her personal development will involve moving to [place] where she has family, a therapist, and her job.   Hazle CocaGOODMAN, Armani Brar S 08/02/2016, 9:12 PM

## 2016-08-02 NOTE — Progress Notes (Signed)
D: Pt at the time of assessment was flat and withdrawn to self even while in the dayroom. Pt at the time endorsed moderate depression; "I feel a little more depressed today than usual." Pt denied anxiety, SI, HI or AVH. Pt remained calm and cooperative.  A: Medications offered as prescribed. All patient's questions and concerns addressed. Support, encouragement, and safe environment provided. 15-minute safety checks continue. R: Pt was med compliant. Pt attended wrap-up group. Safety checks continue.

## 2016-08-02 NOTE — [facility] Suicide Risk Assessment (Signed)
[facility] Discharge Suicide Risk Assessment   Principal Problem: Schizoaffective disorder, bipolar type [facility]) Discharge Diagnoses:  Patient Active Problem List   Diagnosis Date Noted  . Schizoaffective disorder, bipolar type (HCC) [F25.0] 07/28/2016  . 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 Schizophrenia (HCC) [F20.9] 06/11/2015  . Psychosis [F29] 06/11/2015   Patient is a 41 year old female diagnosed with schizoaffective disorder, bipolar type was brought 10 to the ED after she was found walking, picking up some trash stating that she was trying to make the nipple but cleaner. Patient was noted on admission to be responding to internal stimuli, was pacing up and down, talking about spirits. She stated that she was not taking her medications as prescribed. X  This morning patient reports that she is doing fairly well, denies hearing voices, reports that she's sleeping well, is calmer, has no thoughts of hurting herself or others. Patient's thought processes are also organized, she is able to have a conversation, and in no disorganized behaviors noted. Her thought processes are clear. Discussed in length with patient the need for medication compliance and also her diagnosis of schizoaffective disorder Total Time spent with patient: 30 minutes  Musculoskeletal: Strength & Muscle Tone: within normal limits Gait & Station: normal Patient leans: N/A  Psychiatric Specialty Exam: Review of Systems  Constitutional: Negative.  Negative for fever and malaise/fatigue.  HENT: Negative.  Negative for congestion and sore throat.   Eyes: Negative.  Negative for blurred vision, double vision, discharge and redness.  Respiratory: Negative.  Negative for cough, shortness of breath and wheezing.   Cardiovascular: Negative.  Negative for chest pain and palpitations.  Gastrointestinal: Negative.  Negative for abdominal pain, diarrhea, heartburn, nausea and vomiting.  Genitourinary: Negative.   Negative for dysuria.  Musculoskeletal: Negative.  Negative for falls, joint pain and myalgias.  Skin: Negative.  Negative for rash.  Neurological: Negative.  Negative for dizziness, seizures, loss of consciousness, weakness and headaches.  Endo/Heme/Allergies: Negative.  Negative for environmental allergies.  Psychiatric/Behavioral: Negative.  Negative for depression, hallucinations, memory loss, substance abuse and suicidal ideas. The patient is not nervous/anxious and does not have insomnia.     Blood pressure 124/89, pulse 100, temperature 98.7 F (37.1 C), temperature source Oral, resp. rate 17, height 5' (1.524 m), weight 73.5 kg (162 lb), SpO2 97 %.Body mass index is 31.64 kg/m.  General Appearance: Casual  Eye Contact::  Good  Speech:  Clear and Coherent and Normal Rate409  Volume:  Normal  Mood:  Euthymic  Affect:  Congruent and Full Range  Thought Process:  Coherent, Goal Directed and Descriptions of Associations: Intact  Orientation:  Full (Time, Place, and Person)  Thought Content:  WDL  Suicidal Thoughts:  No  Homicidal Thoughts:  No  Memory:  Immediate;   Fair Recent;   Fair Remote;   Fair  Judgement:  Intact  Insight:  Present  Psychomotor Activity:  Normal  Concentration:  Fair  Recall:  [employer] of Knowledge:Fair  Language: Fair  Akathisia:  No  Handed:  Right  AIMS (if indicated):     Assets:  Desire for Improvement Physical Health Social Support  Sleep:  Number of Hours: 5  Cognition: WNL  ADL's:  Intact   Mental Status Per Nursing Assessment::   On Admission:     Demographic Factors:  Low socioeconomic status  Loss Factors: Financial problems/change in socioeconomic status  Historical Factors: Impulsivity  Risk Reduction Factors:   Sense of responsibility to family and Religious beliefs about  death  Continued Clinical Symptoms:  Previous Psychiatric Diagnoses and Treatments  Cognitive Features That Contribute To Risk:  None     Suicide Risk:  Minimal: No identifiable suicidal ideation.  Patients presenting with no risk factors but with morbid ruminations; may be classified as minimal risk based on the severity of the depressive symptoms  Follow-up Information    [facility] on 08/07/2016.   Specialty:  Behavioral Health Why:  Hospital follow-up appt on Monday at 9:50AM. Please bring photo ID and hospital discharge paperwork/list of medications to this appt. Thank you. Contact information: [address] [place] [address] [phone]        [facility].   Contact information: [address] [phone]          Plan Of Care/Follow-up recommendations:  Activity:  As tolerated Diet:  Regular Other:  Keep follow-up appointments and take medications as prescribed  Nelly RoutKUMAR,Aundray Cartlidge, MD 08/02/2016, 11:15 AM

## 2016-08-02 NOTE — Progress Notes (Signed)
Recreation Therapy Notes   Date: 08/02/2016 Time: 10:00am Location: [address] Dayroom  Group Topic: Problem-Solving  Goal Area(s) Addresses:  Pts will successfully identify problems they are currently facing. Pts will successfully offer solutions to anonymous problems.  Behavioral Response: Engaged  Intervention:  Development worker, international aidencils Printer Paper  Activity: Pts were asked to write down a current problem they are trying to solve. Pts were then given other peoples papers and asked to offer solutions for someone else problem.  Education: Problem-Solving, Discharge Planning  Education Outcome: Acknowledges understanding  Clinical Observations/Feedback: Pt actively participated in activity by identifying her problem as arguing with her family. Pt offered solutions to other people's problems. Pt identified that by receiving some suggestions for solutions from other individuals it was helpful. Pt identified that by offering solutions to other people's problems, it helped her identify a solution to her problem.   [name], Recreation Therapy Intern  [name], LRT/CTRS         Marvell FullerRachel Meyer 08/02/2016 11:32 AM

## 2016-08-02 NOTE — Discharge Summary (Signed)
Physician Discharge Summary Note  Patient:  Sarah Walton is an 41 y.o., female  MRN:  [id number]  DOB:  1975/01/31  Patient phone:  [phone] (home)   Patient address:   [address] [place] [address],   Total Time spent with patient: Greater than 30 minutes  Date of Admission:  07/28/2016  Date of Discharge: 08-02-17  Reason for Admission: Bizarre behavior.  Principal Problem: Schizoaffective disorder, bipolar type [facility])  Discharge Diagnoses: Patient Active Problem List   Diagnosis Date Noted  . Schizoaffective disorder, bipolar type (HCC) [F25.0] 07/28/2016  . 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 Schizophrenia (HCC) [F20.9] 06/11/2015  . Psychosis [F29] 06/11/2015   Past Psychiatric History: Schizoaffective disorder, Bipolar-type.  Past Medical History:  Past Medical History:  Diagnosis Date  . Anxiety   . History of migraine headaches    History reviewed. No pertinent surgical history. Family History:  Family History  Problem Relation Age of Onset  . Anemia Mother    Family Psychiatric  History:   Social History:  History  Alcohol Use No     History  Drug Use No    Social History   Social History  . Marital status: Single    Spouse name: N/A  . Number of children: N/A  . Years of education: N/A   Social History Main Topics  . Smoking status: Never Smoker  . Smokeless tobacco: Never Used  . Alcohol use No  . Drug use: No  . Sexual activity: Not Asked   Other Topics Concern  . None   Social History Narrative  . None   Hospital Course:  Patient is a 41 year old female diagnosed with schizoaffective disorder, bipolar type was brought 10 to the ED after she was found walking, picking up some trash stating that she was trying to make the nipple but cleaner. Patient was noted on admission to be responding to internal stimuli, was pacing up and down, talking about spirits. She stated that she was not taking her  medications as prescribed. X  After evaluation of her presenting symptoms, [name] was started on the medication regimen for her presenting symptoms. She received & was discharged on' Abilify 5 mg daily for mood control, Hydroxyzine 25 mg prn for anxiety & trazodone 100 mg for insomnia. She was enrolled & participated in the group counseling sessions being offered & held on this unit. She learned coping skills. She presented no other significant health issues that required treatment. She tolerated her treatment regimen without any adverse effects or reactions reported.  This morning, patient was seen by the attending psychiatrist, she reports that she is doing fairly well, denies hearing voices, reports that she's sleeping well, is calmer, has no thoughts of hurting herself or others. Patient's thought processes are also organized, she is able to have a conversation, and in no disorganized behaviors noted. Her thought processes are clearer. Discussed in length with patient the need for medication compliance and also her diagnosis of schizoaffective disorder requires to be in check by her outpatient psychiatric provider.  [name] was provided with a 7 days worth, supply samples of her [facility] discharge medications. She left [facility] with all personal belongings in no apparent distress. Transportation per aunt.  Physical Findings: AIMS: Facial and Oral Movements Muscles of Facial Expression: None, normal Lips and Perioral Area: None, normal Jaw: None, normal Tongue: None, normal,Extremity Movements Upper (arms, wrists, hands, fingers): None, normal Lower (legs, knees, ankles, toes): None, normal, Trunk Movements Neck, shoulders, hips:  None, normal, Overall Severity Severity of abnormal movements (highest score from questions above): None, normal Incapacitation due to abnormal movements: None, normal Patient's awareness of abnormal movements (rate only patient's report): No Awareness, Dental Status Current  problems with teeth and/or dentures?: No Does patient usually wear dentures?: No  CIWA:    COWS:     Musculoskeletal: Strength & Muscle Tone: within normal limits Gait & Station: normal Patient leans: N/A  Psychiatric Specialty Exam: Physical Exam  Constitutional: She is oriented to person, place, and time. She appears well-developed.  HENT:  Head: Normocephalic.  Eyes: Pupils are equal, round, and reactive to light.  Neck: Normal range of motion.  Cardiovascular: Normal rate.   Respiratory: Effort normal.  GI: Soft.  Genitourinary:  Genitourinary Comments: Deferred  Musculoskeletal: Normal range of motion.  Neurological: She is alert and oriented to person, place, and time.  Skin: Skin is warm and dry.    Review of Systems  Constitutional: Negative.   HENT: Negative.   Eyes: Negative.   Respiratory: Negative.   Cardiovascular: Negative.   Gastrointestinal: Negative.   Genitourinary: Negative.   Musculoskeletal: Negative.   Skin: Negative.   Neurological: Negative.   Endo/Heme/Allergies: Negative.   Psychiatric/Behavioral: Positive for depression (Stable) and hallucinations (Hx. Psychosis). Negative for memory loss, substance abuse and suicidal ideas. The patient has insomnia (Stable). The patient is not nervous/anxious.     Blood pressure 124/89, pulse 100, temperature 98.7 F (37.1 C), temperature source Oral, resp. rate 17, height 5' (1.524 m), weight 73.5 kg (162 lb), SpO2 97 %.Body mass index is 31.64 kg/m.  See Md's SRA   Have you used any form of tobacco in the last 30 days? (Cigarettes, Smokeless Tobacco, Cigars, and/or Pipes): No  Has this patient used any form of tobacco in the last 30 days? (Cigarettes, Smokeless Tobacco, Cigars, and/or Pipes): No  Blood Alcohol level:  Lab Results  Component Value Date   ETH <5 07/28/2016   ETH <5 01/24/2016   Metabolic Disorder Labs:  Lab Results  Component Value Date   HGBA1C 5.9 (H) 07/30/2016   MPG 123  07/30/2016   Lab Results  Component Value Date   PROLACTIN 58.2 (H) 07/30/2016   PROLACTIN 62.9 (H) 06/16/2015   Lab Results  Component Value Date   CHOL 166 07/30/2016   TRIG 77 07/30/2016   HDL 53 07/30/2016   CHOLHDL 3.1 07/30/2016   VLDL 15 07/30/2016   LDLCALC 98 07/30/2016   LDLCALC 83 06/16/2015   See Psychiatric Specialty Exam and Suicide Risk Assessment completed by Attending Physician prior to discharge.  Discharge destination:  Home  Is patient on multiple antipsychotic therapies at discharge:  No   Has Patient had three or more failed trials of antipsychotic monotherapy by history:  No  Recommended Plan for Multiple Antipsychotic Therapies: NA  Allergies as of 08/02/2016      Reactions   Penicillins Other (See Comments)   Childhood Has patient had a PCN reaction causing immediate rash, facial/tongue/throat swelling, SOB or lightheadedness with hypotension: n/a Has patient had a PCN reaction causing severe rash involving mucus membranes or skin necrosis: n/a Has patient had a PCN reaction that required hospitalization: n/a Has patient had a PCN reaction occurring within the last 10 years: n/a If all of the above answers are "NO", then may proceed with Cephalosporin use.      Medication List    STOP taking these medications   oseltamivir 75 MG capsule Commonly known as:  TAMIFLU  TAKE these medications     Indication  ARIPiprazole 5 MG tablet Commonly known as:  ABILIFY Take 1 tablet (5 mg total) by mouth 2 (two) times daily. For mood control What changed:  medication strength  how much to take  when to take this  additional instructions  Another medication with the same name was removed. Continue taking this medication, and follow the directions you see here.  Indication:  Mood control   hydrOXYzine 25 MG tablet Commonly known as:  ATARAX/VISTARIL Take 1 tablet (25 mg total) by mouth 3 (three) times daily as needed for anxiety.   Indication:  Feeling Anxious   traZODone 100 MG tablet Commonly known as:  DESYREL Take 1 tablet (100 mg total) by mouth at bedtime as needed for sleep.  Indication:  Trouble Sleeping      Follow-up [employer]    [facility]. Go on 08/07/2016.   Specialty:  Behavioral Health Why:  Hospital follow-up appt on Monday at 9:50AM. Please bring photo ID and hospital discharge paperwork/list of medications to this appt. Thank you. Contact information: [address] [place] [address] [phone]        [facility].   Contact information: [address] [facility] [address] [phone]         Follow-up recommendations: Activity:  As tolerated Diet: As recommended by your primary care doctor. Keep all scheduled follow-up appointments as recommended.    Comments: Patient is instructed prior to discharge to: Take all medications as prescribed by his/her mental healthcare provider. Report any adverse effects and or reactions from the medicines to his/her outpatient provider promptly. Patient has been instructed & cautioned: To not engage in alcohol and or illegal drug use while on prescription medicines. In the event of worsening symptoms, patient is instructed to call the crisis hotline, 911 and or go to the nearest ED for appropriate evaluation and treatment of symptoms. To follow-up with his/her primary care provider for your other medical issues, concerns and or health care needs.   Signed: Sanjuana KavaNwoko, Agnes I, NP, PMHNP, FNP-BC 08/02/2016, 11:05 AM

## 2016-08-02 NOTE — Progress Notes (Signed)
  [facility] :  Will you be returning to the same living situation after discharge:  Yes,  home with aunt temporarily, then to [facility] with cousin At discharge, do you have transportation home?: Yes,  Aunt Do you have the ability to pay for your medications: Yes,  mental health  Release of information consent forms completed and in the chart;  Patient's signature needed at discharge.  Patient to Follow up at: Follow-up Information    [facility]. Go on 08/07/2016.   Specialty:  Behavioral Health Why:  Hospital follow-up appt on Monday at 9:50AM. Please bring photo ID and hospital discharge paperwork/list of medications to this appt. Thank you. Contact information: [address] [place] [address] [phone]        [facility] Follow [facility].   Contact information: [address] [facility] [address] [phone]          Next level of care provider has access to [facility] Link:no  Safety Planning and Suicide Prevention discussed: Yes,  yes  Have you used any form of tobacco in the last 30 days? (Cigarettes, Smokeless Tobacco, Cigars, and/or Pipes): No  Has patient been referred to the Quitline?: N/A patient is not a smoker  Patient has been referred for addiction treatment: N/A  Ida RogueRodney B Alpheus Stiff 08/02/2016, 10:43 AM

## 2017-09-06 IMAGING — CR DG CHEST 2V
2 series · 2 of 2 positions shown · non-contrast
Comparison: None.

CLINICAL DATA: Fever, tachycardia for 1 day.

EXAM:
CHEST  2 VIEW

[chest lat]
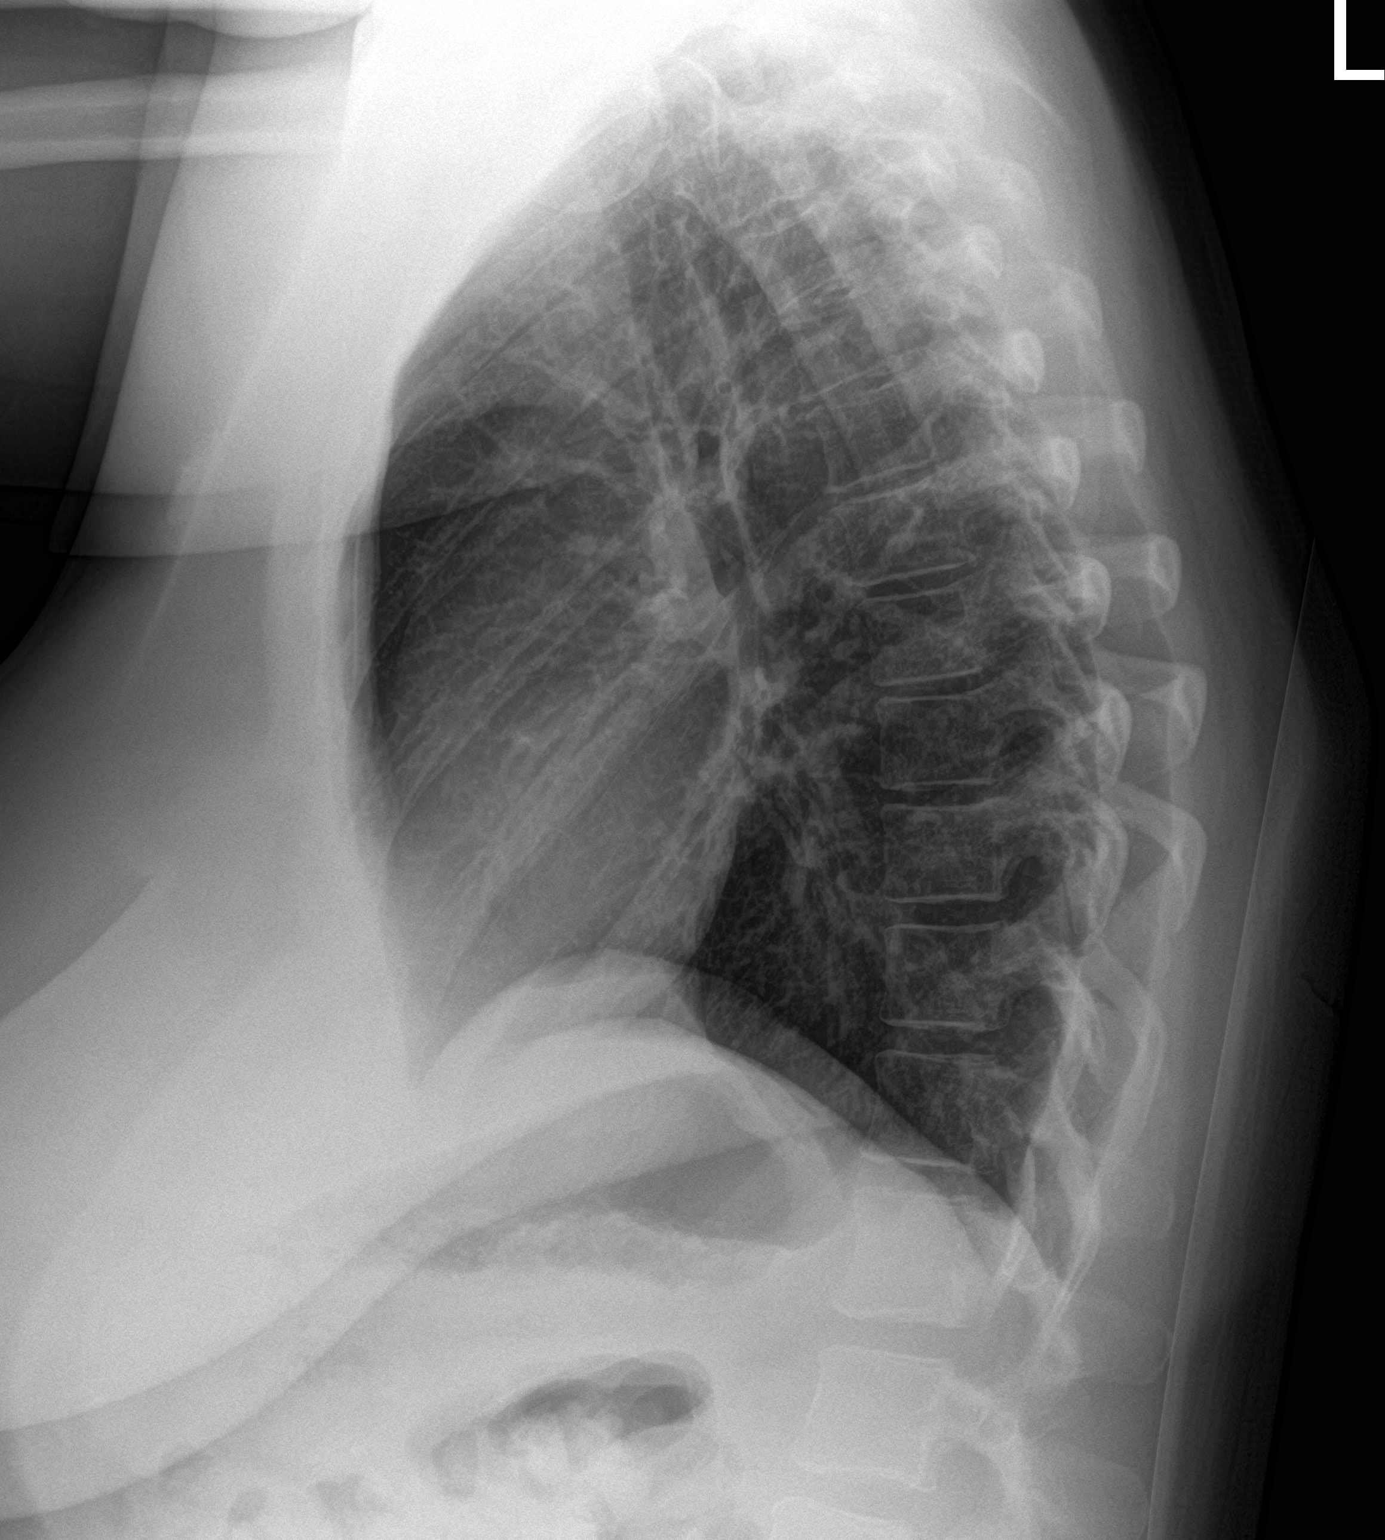

[chest ap]
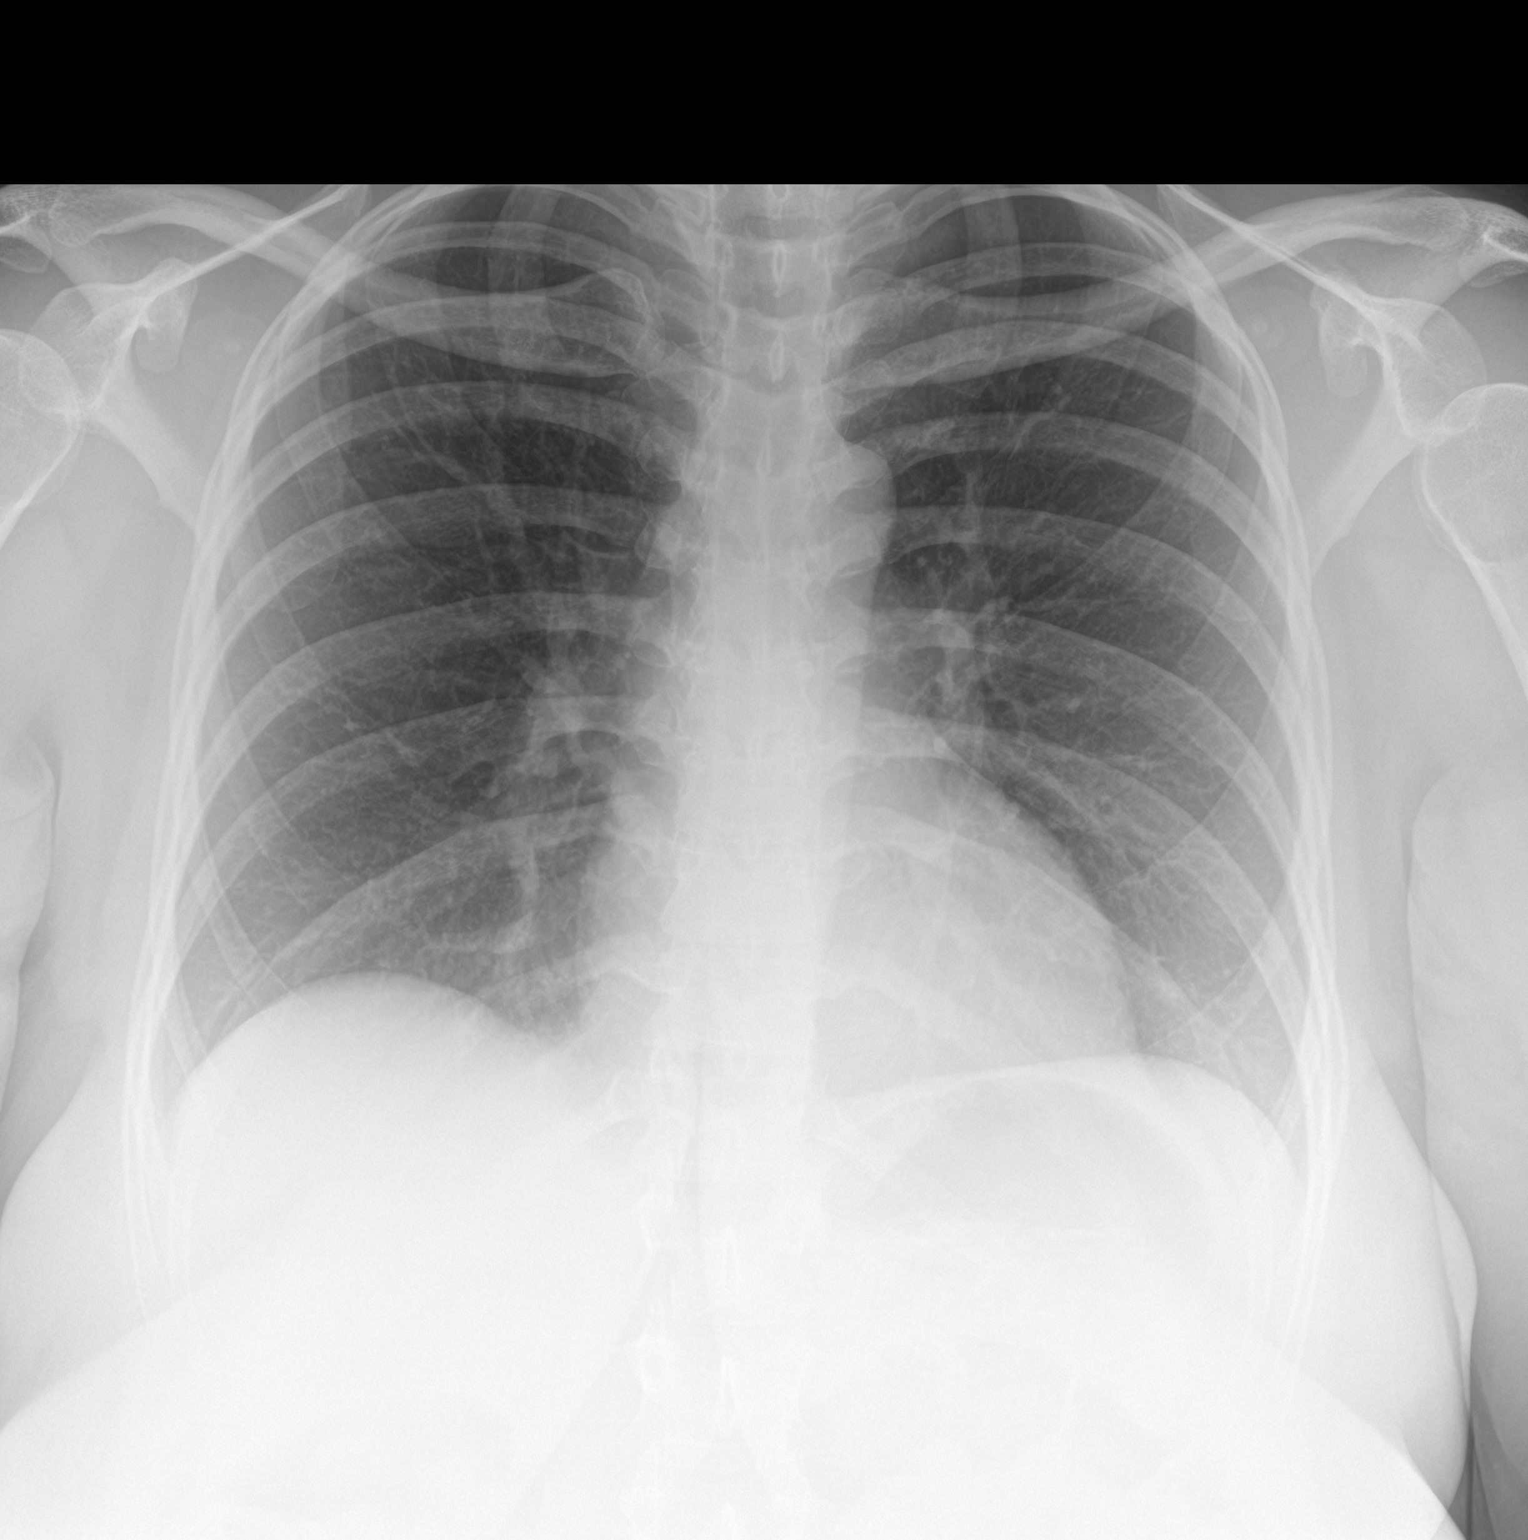

[2 of 2 positions shown; findings below may reference images not displayed]

FINDINGS: Cardiomediastinal silhouette is normal. No pleural effusions or
focal consolidations. Trachea projects midline and there is no
pneumothorax. Soft tissue planes and included osseous structures are
non-suspicious.
IMPRESSION: Normal chest.
